# Patient Record
Sex: Male | Born: 1970 | Race: White | Hispanic: No | Smoking: Former smoker
Health system: Southern US, Community
[De-identification: ages and names within clinical notes are randomized; demographics above are authoritative.]

## PROBLEM LIST (undated history)

## (undated) DIAGNOSIS — G510 Bell's palsy: Secondary | ICD-10-CM

## (undated) DIAGNOSIS — R609 Edema, unspecified: Secondary | ICD-10-CM

## (undated) DIAGNOSIS — M549 Dorsalgia, unspecified: Secondary | ICD-10-CM

## (undated) DIAGNOSIS — D8689 Sarcoidosis of other sites: Secondary | ICD-10-CM

## (undated) DIAGNOSIS — I10 Essential (primary) hypertension: Secondary | ICD-10-CM

## (undated) DIAGNOSIS — M1990 Unspecified osteoarthritis, unspecified site: Secondary | ICD-10-CM

## (undated) DIAGNOSIS — T7840XA Allergy, unspecified, initial encounter: Secondary | ICD-10-CM

## (undated) DIAGNOSIS — K219 Gastro-esophageal reflux disease without esophagitis: Secondary | ICD-10-CM

## (undated) DIAGNOSIS — M8580 Other specified disorders of bone density and structure, unspecified site: Secondary | ICD-10-CM

## (undated) DIAGNOSIS — H539 Unspecified visual disturbance: Secondary | ICD-10-CM

## (undated) HISTORY — DX: Unspecified visual disturbance: H53.9

## (undated) HISTORY — DX: Edema, unspecified: R60.9

## (undated) HISTORY — DX: Dorsalgia, unspecified: M54.9

## (undated) HISTORY — PX: EYE SURGERY: SHX253

## (undated) HISTORY — DX: Essential (primary) hypertension: I10

## (undated) HISTORY — DX: Bell's palsy: G51.0

## (undated) HISTORY — DX: Sarcoidosis of other sites: D86.89

## (undated) HISTORY — DX: Unspecified osteoarthritis, unspecified site: M19.90

## (undated) HISTORY — DX: Other specified disorders of bone density and structure, unspecified site: M85.80

## (undated) HISTORY — DX: Gastro-esophageal reflux disease without esophagitis: K21.9

## (undated) HISTORY — DX: Allergy, unspecified, initial encounter: T78.40XA

---
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
## 1999-02-02 HISTORY — PX: HERNIA REPAIR: SHX51

## 2012-07-10 ENCOUNTER — Emergency Department (INDEPENDENT_AMBULATORY_CARE_PROVIDER_SITE_OTHER): Payer: 59

## 2012-07-10 ENCOUNTER — Emergency Department (HOSPITAL_COMMUNITY)
Admission: EM | Admit: 2012-07-10 | Discharge: 2012-07-10 | Disposition: A | Discharge status: Home / Self Care | Payer: 59 | Source: Home / Self Care | Attending: Family Medicine | Admitting: Family Medicine

## 2012-07-10 ENCOUNTER — Encounter (HOSPITAL_COMMUNITY): Payer: Self-pay | Admitting: Emergency Medicine

## 2012-07-10 DIAGNOSIS — S62609A Fracture of unspecified phalanx of unspecified finger, initial encounter for closed fracture: Secondary | ICD-10-CM

## 2012-07-10 NOTE — ED Provider Notes (Signed)
Medical screening examination/treatment/procedure(s) were performed by non-physician practitioner and as supervising physician I was immediately available for consultation/collaboration.   [name],Adrian [name]; MD  Adrian Mundorf Moreno-Coll, MD 07/10/12 1546 

## 2012-07-10 NOTE — ED Provider Notes (Signed)
History     CSN: [id number]  Arrival date & time 07/10/12  1054   First MD Initiated Contact with Patient 07/10/12 1249      Chief Complaint  Patient presents with  . Hand Injury    (Consider location/radiation/quality/duration/timing/severity/associated sxs/prior treatment) HPI Comments: Pt presents c/o left hand injury Friday while playing softball.  He was going to catch a ball when he slipped and caught himself on the ground with his left hand in the glove.  He had immediate pain and some swelling in the proximal part of the ring finger.  He had almost immediate swelling in the finger and since then the entire hand has started to swell.  He has been icing it and taking ibuprofen but the pain and swelling continues to increase.  Denies any numbness distal to the area that hurts.    Patient is a 42 y.o. male presenting with hand injury.  Hand Injury Associated symptoms: no fatigue, no fever and no neck pain     History reviewed. No pertinent past medical history.  Past Surgical History  Procedure Laterality Date  . Eye surgery    . Brain surgery      No family history on file.  History  Substance Use Topics  . Smoking status: Never Smoker   . Smokeless tobacco: Not on file  . Alcohol Use: No      Review of Systems  Constitutional: Negative for fever, chills and fatigue.  HENT: Negative for sore throat, neck pain and neck stiffness.   Eyes: Negative for visual disturbance.  Respiratory: Negative for cough and shortness of breath.   Cardiovascular: Negative for chest pain, palpitations and leg swelling.  Gastrointestinal: Negative for nausea, vomiting, abdominal pain, diarrhea and constipation.  Genitourinary: Negative for dysuria, urgency, frequency and hematuria.  Musculoskeletal: Negative for myalgias and arthralgias.       See HPI   Skin: Negative for rash.  Neurological: Negative for dizziness, weakness and light-headedness.    Allergies  Review of  patient's allergies indicates no known allergies.  Home Medications  No current outpatient prescriptions on file.  BP 149/89  Pulse 79  Temp(Src) 98.1 F (36.7 C) (Oral)  Resp 24  SpO2 97%  Physical Exam  Constitutional: He is oriented to person, place, and time. He appears well-developed and well-nourished. No distress.  HENT:  Head: Normocephalic and atraumatic.  Eyes: EOM are normal. Pupils are equal, round, and reactive to light.  Cardiovascular: Normal rate and regular rhythm.  Exam reveals no gallop and no friction rub.   No murmur heard. Pulmonary/Chest: Effort normal and breath sounds normal. No respiratory distress. He has no wheezes. He has no rales.  Abdominal: Soft. There is no tenderness.  Musculoskeletal:       Left hand: He exhibits decreased range of motion, tenderness (all along 4th digit, worst at proximal phalange) and swelling (diffuse hand swelling, worse at 4th digit.  Ecchymosis in 4th and 5th digits as well ). He exhibits normal two-point discrimination and normal capillary refill. Normal sensation noted.  Neurological: He is oriented to person, place, and time.  Skin: Skin is warm and dry. No rash noted.  Psychiatric: He has a normal mood and affect. Judgment normal.    ED Course  Procedures (including critical care time)  Labs Reviewed - No data to display Dg Hand Complete Left  07/10/2012   *RADIOLOGY REPORT*  Clinical Data: Left hand injury and pain.  LEFT HAND - COMPLETE 3+ VIEW  Comparison:  None  Findings: A mildly comminuted oblique fracture through the ring finger proximal phalanx is noted.  This does not appear to reach the articular surface. A nondisplaced intrarticular fracture at the base of the little finger proximal phalanx is noted. There is no evidence of subluxation or dislocation. No other focal bony abnormalities are identified. There is no evidence of radiopaque foreign body.  IMPRESSION: Proximal phalanx fractures of the ring and little  fingers as described.   Original Report Authenticated By: Harmon Pier, M.D.     1. Finger fracture, left, closed, initial encounter       MDM  Spoke with hand surgeon on call, he would like to see pt in the office right now.  Pt agrees and will head over there now.          Graylon Good, PA-C 07/10/12 1430

## 2012-07-10 NOTE — ED Notes (Signed)
Pt c/o left hand inj onset Friday while playing soft ball Reports he fell on field and landed on left hand while he still had his glove on Sxs today include: swelling, bruising, mild pain w/movements Has been icing hand and taking ibuprofen... He is alert and oriented w/no signs of acute distress.

## 2012-07-11 ENCOUNTER — Encounter (HOSPITAL_BASED_OUTPATIENT_CLINIC_OR_DEPARTMENT_OTHER): Payer: Self-pay | Admitting: *Deleted

## 2012-07-11 NOTE — Progress Notes (Signed)
No labs needed

## 2012-07-12 ENCOUNTER — Other Ambulatory Visit: Payer: Self-pay | Admitting: Orthopedic Surgery

## 2012-07-17 ENCOUNTER — Encounter (HOSPITAL_BASED_OUTPATIENT_CLINIC_OR_DEPARTMENT_OTHER): Payer: Self-pay | Admitting: *Deleted

## 2012-07-17 ENCOUNTER — Ambulatory Visit (HOSPITAL_BASED_OUTPATIENT_CLINIC_OR_DEPARTMENT_OTHER): Payer: 59 | Admitting: Certified Registered"

## 2012-07-17 ENCOUNTER — Ambulatory Visit (HOSPITAL_COMMUNITY): Payer: 59

## 2012-07-17 ENCOUNTER — Ambulatory Visit (HOSPITAL_BASED_OUTPATIENT_CLINIC_OR_DEPARTMENT_OTHER)
Admission: RE | Admit: 2012-07-17 | Discharge: 2012-07-17 | Disposition: A | Discharge status: Home / Self Care | Payer: 59 | Source: Ambulatory Visit | Attending: Orthopedic Surgery | Admitting: Orthopedic Surgery

## 2012-07-17 ENCOUNTER — Encounter (HOSPITAL_BASED_OUTPATIENT_CLINIC_OR_DEPARTMENT_OTHER)
Admission: RE | Disposition: A | Discharge status: Home / Self Care | Payer: Self-pay | Source: Ambulatory Visit | Attending: Orthopedic Surgery

## 2012-07-17 ENCOUNTER — Encounter (HOSPITAL_BASED_OUTPATIENT_CLINIC_OR_DEPARTMENT_OTHER): Payer: Self-pay | Admitting: Certified Registered"

## 2012-07-17 DIAGNOSIS — Z87891 Personal history of nicotine dependence: Secondary | ICD-10-CM | POA: Insufficient documentation

## 2012-07-17 DIAGNOSIS — Y9364 Activity, baseball: Secondary | ICD-10-CM | POA: Insufficient documentation

## 2012-07-17 DIAGNOSIS — IMO0002 Reserved for concepts with insufficient information to code with codable children: Secondary | ICD-10-CM | POA: Insufficient documentation

## 2012-07-17 DIAGNOSIS — W219XXA Striking against or struck by unspecified sports equipment, initial encounter: Secondary | ICD-10-CM | POA: Insufficient documentation

## 2012-07-17 HISTORY — PX: OPEN REDUCTION INTERNAL FIXATION (ORIF) PROXIMAL PHALANX: SHX6235

## 2012-07-17 LAB — POCT HEMOGLOBIN-HEMACUE: Hemoglobin: 16.8 g/dL (ref 13.0–17.0)

## 2012-07-17 SURGERY — OPEN REDUCTION INTERNAL FIXATION (ORIF) PROXIMAL PHALANX
Anesthesia: General | Site: Hand | Laterality: Left | Wound class: Clean

## 2012-07-17 MED ORDER — MIDAZOLAM HCL 2 MG/2ML IJ SOLN
1.0000 mg | INTRAMUSCULAR | Status: DC | PRN
  Administered 2012-07-17: 2 mg via INTRAVENOUS

## 2012-07-17 MED ORDER — OXYCODONE HCL 5 MG/5ML PO SOLN: 5.0000 mg | Freq: Once | ORAL | Status: DC | PRN

## 2012-07-17 MED ORDER — ONDANSETRON HCL 4 MG/2ML IJ SOLN
INTRAMUSCULAR | Status: DC | PRN
  Administered 2012-07-17: 4 mg via INTRAVENOUS

## 2012-07-17 MED ORDER — FENTANYL CITRATE 0.05 MG/ML IJ SOLN
50.0000 ug | INTRAMUSCULAR | Status: DC | PRN
  Administered 2012-07-17: 100 ug via INTRAVENOUS

## 2012-07-17 MED ORDER — BUPIVACAINE-EPINEPHRINE PF 0.5-1:200000 % IJ SOLN
INTRAMUSCULAR | Status: DC | PRN
  Administered 2012-07-17: 23 mL

## 2012-07-17 MED ORDER — PROPOFOL 10 MG/ML IV BOLUS
INTRAVENOUS | Status: DC | PRN
  Administered 2012-07-17: 200 mg via INTRAVENOUS

## 2012-07-17 MED ORDER — ONDANSETRON HCL 4 MG/2ML IJ SOLN: 4.0000 mg | Freq: Once | INTRAMUSCULAR | Status: DC | PRN

## 2012-07-17 MED ORDER — LIDOCAINE HCL (CARDIAC) 20 MG/ML IV SOLN
INTRAVENOUS | Status: DC | PRN
  Administered 2012-07-17: 50 mg via INTRAVENOUS

## 2012-07-17 MED ORDER — LACTATED RINGERS IV SOLN
INTRAVENOUS | Status: DC
  Administered 2012-07-17 (×2): via INTRAVENOUS

## 2012-07-17 MED ORDER — OXYCODONE HCL 5 MG PO TABS: 5.0000 mg | ORAL_TABLET | Freq: Once | ORAL | Status: DC | PRN

## 2012-07-17 MED ORDER — LACTATED RINGERS IV SOLN: INTRAVENOUS | Status: DC

## 2012-07-17 MED ORDER — HYDROMORPHONE HCL PF 1 MG/ML IJ SOLN: 0.2500 mg | INTRAMUSCULAR | Status: DC | PRN

## 2012-07-17 MED ORDER — CEFAZOLIN SODIUM-DEXTROSE 2-3 GM-% IV SOLR
2.0000 g | INTRAVENOUS | Status: AC
  Administered 2012-07-17: 2 g via INTRAVENOUS

## 2012-07-17 SURGICAL SUPPLY — 49 items
BANDAGE COBAN STERILE 2 (GAUZE/BANDAGES/DRESSINGS) IMPLANT
BANDAGE GAUZE ELAST BULKY 4 IN (GAUZE/BANDAGES/DRESSINGS) ×2 IMPLANT
BIT DRILL 1.1 (BIT) ×1
BIT DRILL 60X20X1.1XQC TMX (BIT) ×1 IMPLANT
BIT DRIVER 1.3 (BIT) IMPLANT
BIT DRL 60X20X1.1XQC TMX (BIT) ×1
BLADE MINI RND TIP GREEN BEAV (BLADE) IMPLANT
BLADE SURG 15 STRL LF DISP TIS (BLADE) ×1 IMPLANT
BLADE SURG 15 STRL SS (BLADE) ×1
BNDG COHESIVE 4X5 TAN STRL (GAUZE/BANDAGES/DRESSINGS) ×2 IMPLANT
BNDG ESMARK 4X9 LF (GAUZE/BANDAGES/DRESSINGS) ×2 IMPLANT
CHLORAPREP W/TINT 26ML (MISCELLANEOUS) ×2 IMPLANT
COVER MAYO STAND STRL (DRAPES) ×2 IMPLANT
COVER TABLE BACK 60X90 (DRAPES) ×2 IMPLANT
CUFF TOURNIQUET SINGLE 18IN (TOURNIQUET CUFF) ×2 IMPLANT
DRAPE C-ARM 42X72 X-RAY (DRAPES) ×2 IMPLANT
DRAPE EXTREMITY T 121X128X90 (DRAPE) ×2 IMPLANT
DRAPE SURG 17X23 STRL (DRAPES) ×2 IMPLANT
DRIVER BIT 1.5 (TRAUMA) IMPLANT
DRSG EMULSION OIL 3X3 NADH (GAUZE/BANDAGES/DRESSINGS) ×2 IMPLANT
GLOVE BIO SURGEON STRL SZ 6.5 (GLOVE) ×4 IMPLANT
GLOVE BIO SURGEON STRL SZ7.5 (GLOVE) ×2 IMPLANT
GLOVE BIOGEL PI IND STRL 7.0 (GLOVE) ×2 IMPLANT
GLOVE BIOGEL PI IND STRL 8 (GLOVE) ×1 IMPLANT
GLOVE BIOGEL PI INDICATOR 7.0 (GLOVE) ×2
GLOVE BIOGEL PI INDICATOR 8 (GLOVE) ×1
GOWN PREVENTION PLUS XLARGE (GOWN DISPOSABLE) ×6 IMPLANT
K-WIRE .045X4 (WIRE) ×4 IMPLANT
NEEDLE HYPO 25X1 1.5 SAFETY (NEEDLE) IMPLANT
NS IRRIG 1000ML POUR BTL (IV SOLUTION) ×2 IMPLANT
PACK BASIN DAY SURGERY FS (CUSTOM PROCEDURE TRAY) ×2 IMPLANT
PAD CAST 4YDX4 CTTN HI CHSV (CAST SUPPLIES) ×1 IMPLANT
PADDING CAST ABS 4INX4YD NS (CAST SUPPLIES) ×1
PADDING CAST ABS COTTON 4X4 ST (CAST SUPPLIES) ×1 IMPLANT
PADDING CAST COTTON 4X4 STRL (CAST SUPPLIES) ×1
PLATE T SMALL 1.5MM (Plate) ×2 IMPLANT
RUBBERBAND STERILE (MISCELLANEOUS) IMPLANT
SCREW L 1.5X12 (Screw) ×2 IMPLANT
SCREW LOCKING 1.5X11MM (Screw) ×8 IMPLANT
SCREW NL 1.5X13 (Screw) ×2 IMPLANT
SCREW NONIOC 1.5 10M (Screw) ×2 IMPLANT
SPONGE GAUZE 4X4 12PLY (GAUZE/BANDAGES/DRESSINGS) ×2 IMPLANT
STOCKINETTE 4X48 STRL (DRAPES) ×2 IMPLANT
SUT VICRYL RAPIDE 4/0 PS 2 (SUTURE) ×2 IMPLANT
SYR BULB 3OZ (MISCELLANEOUS) ×2 IMPLANT
SYRINGE 10CC LL (SYRINGE) IMPLANT
TOWEL OR 17X24 6PK STRL BLUE (TOWEL DISPOSABLE) ×2 IMPLANT
TOWEL OR NON WOVEN STRL DISP B (DISPOSABLE) ×2 IMPLANT
UNDERPAD 30X30 INCONTINENT (UNDERPADS AND DIAPERS) ×2 IMPLANT

## 2012-07-17 NOTE — Progress Notes (Signed)
  Assisted Dr. Crews with left, ultrasound guided, supraclavicular block. Side rails up, monitors on throughout procedure. See vital signs in flow sheet. Tolerated Procedure well. 

## 2012-07-17 NOTE — Op Note (Signed)
07/17/2012  8:16 AM  PATIENT:  Adrian [name]  42 y.o. male  PRE-OPERATIVE DIAGNOSIS:  Displaced left ring finger proximal phalanx fracture  POST-OPERATIVE DIAGNOSIS:  Same  PROCEDURE:  ORIF left ring finger proximal phalanx fracture  SURGEON: Cliffton Asters. Janee Morn, MD  PHYSICIAN ASSISTANT: None  ANESTHESIA:  regional and general  SPECIMENS:  None  DRAINS:   None  PREOPERATIVE INDICATIONS:  Adrian [name] is a  42 y.o. male with a diagnosis of displaced left ring finger proximal phalanx fracture. 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  The risks benefits and alternatives were discussed with the patient preoperatively including but not limited to the risks of infection, bleeding, nerve injury, cardiopulmonary complications, the need for revision surgery, among others, and the patient verbalized understanding and consented to proceed.  OPERATIVE IMPLANTS: Small T. plate from the [employer] hand fracture set, with appropriate screws  OPERATIVE FINDINGS: Diaphyseal fracture, mild degree of comminution, unable to obtain and maintain acceptable closed reduction and percutaneous stabilization. Following ORIF, reduction was near-anatomic. No persistent malrotation.  OPERATIVE PROCEDURE:  After receiving prophylactic antibiotics and a regional block by anesthesia, the patient was escorted to the operative theatre and placed in a supine position. General anesthesia was administered. A surgical "time-out" was performed during which the planned procedure, proposed operative site, and the correct patient identity were compared to the operative consent and agreement confirmed by the circulating nurse according to current facility policy.  Following application of a tourniquet to the operative extremity, the exposed skin was prepped with Chloraprep and draped in the usual sterile fashion.    An attempt was made to achieve percutaneous reduction and stabilization. Manipulation and tenaculum assisted  reduction was attempted. A 0.045 inch K wire was used but the resultant reduction proved unsatisfactory after several attempts. Decision was made to proceed with ORIF.  The limb was exsanguinated with an Esmarch bandage and the tourniquet inflated to approximately higher than systolic BP.  An incision was marked and made sharply with a scalpel on the dorsal radial aspect of the proximal phalanx, coursing obliquely across the creases at the MP and PIP joints. This created a dorsal flap that was retracted ulnarly. The extensor tendon was split in the midline and retracted radially and ulnarly. The periosteum and become thickened and this was split in the same manner reflected radially and ulnarly. The fracture was identified, cleaned of debris and irrigated. The fracture was reduced and held with a clamp and a small T. plate from the [employer] hand fracture set was applied on the dorsal radial aspect of the fracture. It was secured to the bone using a locking and nonlocking screws. Final images were obtained revealing satisfactory reduction and alignment. Grossly the digit had good alignment, rotation, and touchdown point. The wound is copiously irrigated and the periosteum closed with 4-0 Vicryl Rapide running suture. Tourniquet was released and additional hemostasis obtained and the extensor tendon was closed with running 3-0 Vicryl suture followed by closure of the skin with 4-0 Vicryl Rapide interrupted and running suture combination. A bulky hand dressing was applied the patient was awakened and taken to room stable condition breathing spontaneously  Disposition: Patient will be discharged home today with typical postop instructions returning in 5-7 days for reevaluation and custom protective splint fabrication in hand therapy.

## 2012-07-17 NOTE — Anesthesia Procedure Notes (Addendum)
Anesthesia Regional Block:  Supraclavicular block  Pre-Anesthetic Checklist: ,, timeout performed, Correct Patient, Correct Site, Correct Laterality, Correct Procedure, Correct Position, site marked, Risks and benefits discussed,  Surgical consent,  Pre-op evaluation,  At surgeon's request and post-op pain management  Laterality: Left and Upper  Prep: chloraprep       Needles:  Injection technique: Single-shot  Needle Type: Echogenic Needle     Needle Length: 5cm 5 cm Needle Gauge: 22 and 22 G    Additional Needles:  Procedures: ultrasound guided (picture in chart) Supraclavicular block Narrative:  Start time: 07/17/2012 8:02 AM End time: 07/17/2012 8:10 AM Injection made incrementally with aspirations every 5 mL.  Performed by: Personally  Anesthesiologist: Sheldon Silvan  Supraclavicular block Procedure Name: LMA Insertion Performed by: Lance Coon Pre-anesthesia Checklist: Patient identified, Emergency Drugs available, Suction available and Patient being monitored Patient Re-evaluated:Patient Re-evaluated prior to inductionOxygen Delivery Method: Circle System Utilized Preoxygenation: Pre-oxygenation with 100% oxygen Intubation Type: IV induction Ventilation: Mask ventilation without difficulty LMA: LMA inserted LMA Size: 5.0 Number of attempts: 1 Airway Equipment and Method: bite block Placement Confirmation: positive ETCO2 Tube secured with: Tape Dental Injury: Teeth and Oropharynx as per pre-operative assessment
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
## 2012-07-17 NOTE — Anesthesia Preprocedure Evaluation (Signed)
Anesthesia Evaluation  Patient identified by MRN, date of birth, ID band Patient awake    Reviewed: Allergy & Precautions, H&P , NPO status , Patient's Chart, lab work & pertinent test results  Airway Mallampati: I TM Distance: >3 FB Neck ROM: Full    Dental  (+) Teeth Intact   Pulmonary  breath sounds clear to auscultation        Cardiovascular Rhythm:Regular Rate:Normal     Neuro/Psych    GI/Hepatic   Endo/Other    Renal/GU      Musculoskeletal   Abdominal   Peds  Hematology   Anesthesia Other Findings   Reproductive/Obstetrics                           Anesthesia Physical Anesthesia Plan  ASA: I  Anesthesia Plan: General   Post-op Pain Management:    Induction: Intravenous  Airway Management Planned: LMA  Additional Equipment:   Intra-op Plan:   Post-operative Plan: Extubation in OR  Informed Consent: I have reviewed the patients History and Physical, chart, labs and discussed the procedure including the risks, benefits and alternatives for the proposed anesthesia with the patient or authorized representative who has indicated his/her understanding and acceptance.   Dental advisory given  Plan Discussed with: CRNA, Anesthesiologist and Surgeon  Anesthesia Plan Comments:         Anesthesia Quick Evaluation  

## 2012-07-17 NOTE — Anesthesia Postprocedure Evaluation (Signed)
  Anesthesia Post-op Note  Patient: Adrian [name]  Procedure(s) Performed: Procedure(s): OPEN REDUCTION INTERNAL FIXATION (ORIF) PROXIMAL PHALANX (Left)  Patient Location: PACU  Anesthesia Type:GA combined with regional for post-op pain  Level of Consciousness: awake, alert  and oriented  Airway and Oxygen Therapy: Patient Spontanous Breathing and Patient connected to face mask oxygen  Post-op Pain: none  Post-op Assessment: Post-op Vital signs reviewed  Post-op Vital Signs: Reviewed  Complications: No apparent anesthesia complications

## 2012-07-17 NOTE — Transfer of Care (Signed)
Immediate Anesthesia Transfer of Care Note  Patient: Adrian [name]  Procedure(s) Performed: Procedure(s): OPEN REDUCTION INTERNAL FIXATION (ORIF) PROXIMAL PHALANX (Left)  Patient Location: PACU  Anesthesia Type:GA combined with regional for post-op pain  Level of Consciousness: awake  Airway & Oxygen Therapy: Patient Spontanous Breathing and Patient connected to face mask oxygen  Post-op Assessment: Report given to PACU RN and Post -op Vital signs reviewed and stable  Post vital signs: Reviewed and stable  Complications: No apparent anesthesia complications

## 2012-07-18 ENCOUNTER — Encounter (HOSPITAL_BASED_OUTPATIENT_CLINIC_OR_DEPARTMENT_OTHER): Payer: Self-pay | Admitting: Orthopedic Surgery

## 2014-09-24 ENCOUNTER — Other Ambulatory Visit: Payer: Commercial Managed Care - HMO | Admitting: Internal Medicine

## 2014-09-24 DIAGNOSIS — Z13220 Encounter for screening for lipoid disorders: Secondary | ICD-10-CM

## 2014-09-24 DIAGNOSIS — Z0000 Encounter for general adult medical examination without abnormal findings: Secondary | ICD-10-CM

## 2014-09-24 LAB — LIPID PANEL
CHOL/HDL RATIO: 3 ratio (ref <=5.0)
CHOLESTEROL: 156 mg/dL (ref 125–200)
HDL: 52 mg/dL (ref >=40)
LDL Cholesterol: 87 mg/dL (ref <130)
TRIGLYCERIDES: 84 mg/dL (ref <150)
VLDL: 17 mg/dL (ref <30)

## 2014-09-24 LAB — CBC WITH DIFFERENTIAL/PLATELET
BASOS ABS: 0.1 10*3/uL (ref 0.0–0.1)
BASOS PCT: 1 % (ref 0–1)
Eosinophils Absolute: 0.1 10*3/uL (ref 0.0–0.7)
Eosinophils Relative: 2 % (ref 0–5)
HCT: 43.3 % (ref 39.0–52.0)
HEMOGLOBIN: 15 g/dL (ref 13.0–17.0)
Lymphocytes Relative: 44 % (ref 12–46)
Lymphs Abs: 2.5 10*3/uL (ref 0.7–4.0)
MCH: 31.5 pg (ref 26.0–34.0)
MCHC: 34.6 g/dL (ref 30.0–36.0)
MCV: 91 fL (ref 78.0–100.0)
MONOS PCT: 9 % (ref 3–12)
MPV: 10.4 fL (ref 8.6–12.4)
Monocytes Absolute: 0.5 10*3/uL (ref 0.1–1.0)
NEUTROS ABS: 2.5 10*3/uL (ref 1.7–7.7)
NEUTROS PCT: 44 % (ref 43–77)
Platelets: 258 10*3/uL (ref 150–400)
RBC: 4.76 MIL/uL (ref 4.22–5.81)
RDW: 13 % (ref 11.5–15.5)
WBC: 5.6 10*3/uL (ref 4.0–10.5)

## 2014-09-24 LAB — COMPLETE METABOLIC PANEL WITH GFR
ALT: 28 U/L (ref 9–46)
AST: 23 U/L (ref 10–40)
Albumin: 4 g/dL (ref 3.6–5.1)
Alkaline Phosphatase: 47 U/L (ref 40–115)
BUN: 10 mg/dL (ref 7–25)
CALCIUM: 9.1 mg/dL (ref 8.6–10.3)
CHLORIDE: 105 mmol/L (ref 98–110)
CO2: 26 mmol/L (ref 20–31)
Creat: 0.96 mg/dL (ref 0.60–1.35)
Glucose, Bld: 91 mg/dL (ref 65–99)
POTASSIUM: 4.2 mmol/L (ref 3.5–5.3)
Sodium: 141 mmol/L (ref 135–146)
Total Bilirubin: 0.9 mg/dL (ref 0.2–1.2)
Total Protein: 6.2 g/dL (ref 6.1–8.1)

## 2014-09-26 ENCOUNTER — Encounter: Payer: Self-pay | Admitting: Internal Medicine

## 2014-09-26 ENCOUNTER — Ambulatory Visit (INDEPENDENT_AMBULATORY_CARE_PROVIDER_SITE_OTHER): Payer: Commercial Managed Care - HMO | Admitting: Internal Medicine

## 2014-09-26 VITALS — BP 138/92 | HR 74 | Temp 98.7°F | Ht 72.0 in | Wt 222.0 lb

## 2014-09-26 DIAGNOSIS — R030 Elevated blood-pressure reading, without diagnosis of hypertension: Secondary | ICD-10-CM | POA: Diagnosis not present

## 2014-09-26 DIAGNOSIS — H6123 Impacted cerumen, bilateral: Secondary | ICD-10-CM | POA: Diagnosis not present

## 2014-09-26 DIAGNOSIS — Z0000 Encounter for general adult medical examination without abnormal findings: Secondary | ICD-10-CM

## 2014-09-26 DIAGNOSIS — IMO0001 Reserved for inherently not codable concepts without codable children: Secondary | ICD-10-CM

## 2014-09-26 LAB — POCT URINALYSIS DIPSTICK
BILIRUBIN UA: NEGATIVE
Blood, UA: NEGATIVE
GLUCOSE UA: NEGATIVE
Ketones, UA: NEGATIVE
Leukocytes, UA: NEGATIVE
NITRITE UA: NEGATIVE
Protein, UA: NEGATIVE
SPEC GRAV UA: 1.01
UROBILINOGEN UA: NEGATIVE
pH, UA: 6

## 2014-09-26 NOTE — Progress Notes (Signed)
   Subjective:    Patient ID: Adrian [name], male    DOB: 1970/10/06, 44 y.o.   MRN: [id number]  HPI  First visit for this 44 year old White Male, brother Adrian [name] who presents for health maintenance exam. Patient just lost his father to complications of AML.  Patient had tetanus immunization in 2015 at [facility] [address].  No known drug allergies  No chronic medications  Patient had fractured left fourth and fifth fingers in 2014 due to sports injury. Treated at [facility]. Left inguinal hernia repair 1998. In 1987 he had a periorbital fracture treated by Dr. Adrian [name].  Social history: He is divorced. 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. Does not smoke. Consumes alcohol in the form of beer occasionally. He enjoys softball canoeing swimming in outdoor activities as well as camping.  Family history: He has a son age 17 in good health and a daughter age 43 in good health. Father was 4 and expired recently due to sepsis being treated for AML at [facility]. Mother living with history of ovarian cancer age 36. One brother age 24 overweight with diabetes mellitus. One sister age 29 overweight.    Review of Systems negative except for tinnitus and occasional back pain     Objective:   Physical Exam  Constitutional: He is oriented to person, place, and time. He appears well-developed and well-nourished. No distress.  HENT:  Head: Normocephalic and atraumatic.  Right Ear: External ear normal.  Left Ear: External ear normal.  Nose: Nose normal.  Mouth/Throat: Oropharynx is clear and moist. No oropharyngeal exudate.  Bilateral impacted cerumen   Eyes: Conjunctivae and EOM are normal. Pupils are equal, round, and reactive to light. Right eye exhibits no discharge. Left eye exhibits no discharge.  Neck: Neck supple. No JVD present. No thyromegaly present.  Cardiovascular: Normal rate, regular rhythm, normal  heart sounds and intact distal pulses.   No murmur heard. Pulmonary/Chest: Effort normal and breath sounds normal. No respiratory distress. He has no wheezes. He has no rales.  Abdominal: Soft. Bowel sounds are normal. He exhibits no distension and no mass. There is no tenderness. There is no rebound and no guarding.  Genitourinary: Prostate normal.  No hernias  Musculoskeletal: He exhibits no edema.  Neurological: He is alert and oriented to person, place, and time. He has normal reflexes. No cranial nerve deficit. Coordination normal.  Skin: Skin is warm and dry. No rash noted. He is not diaphoretic.  Psychiatric: He has a normal mood and affect. His behavior is normal. Judgment and thought content normal.  Vitals reviewed.         Assessment & Plan:  Impacted cerumen both ears-return October 7 to have ears irrigated  Elevated blood pressure-to obtain home blood pressure monitor and return October 7 with home blood pressure readings. Watch diet and exercise. Watch salt intake.

## 2014-10-01 ENCOUNTER — Encounter: Payer: Self-pay | Admitting: Internal Medicine

## 2014-10-01 DIAGNOSIS — H6123 Impacted cerumen, bilateral: Secondary | ICD-10-CM | POA: Insufficient documentation

## 2014-10-01 DIAGNOSIS — R030 Elevated blood-pressure reading, without diagnosis of hypertension: Secondary | ICD-10-CM

## 2014-10-01 DIAGNOSIS — IMO0001 Reserved for inherently not codable concepts without codable children: Secondary | ICD-10-CM | POA: Insufficient documentation

## 2014-10-01 NOTE — Patient Instructions (Signed)
Return October 7 for 6 week blood pressure check and to have cerumen removed from ears by irrigation. Obtain home blood pressure monitor and watch blood pressure at home.

## 2014-11-08 ENCOUNTER — Ambulatory Visit (INDEPENDENT_AMBULATORY_CARE_PROVIDER_SITE_OTHER): Payer: Commercial Managed Care - HMO | Admitting: Internal Medicine

## 2014-11-08 ENCOUNTER — Encounter: Payer: Self-pay | Admitting: Internal Medicine

## 2014-11-08 VITALS — BP 122/84 | HR 80 | Temp 98.1°F | Ht 72.0 in | Wt 229.0 lb

## 2014-11-08 DIAGNOSIS — H6123 Impacted cerumen, bilateral: Secondary | ICD-10-CM

## 2014-11-08 DIAGNOSIS — Z136 Encounter for screening for cardiovascular disorders: Secondary | ICD-10-CM | POA: Diagnosis not present

## 2014-11-08 DIAGNOSIS — Z0130 Encounter for examination of blood pressure without abnormal findings: Secondary | ICD-10-CM

## 2014-11-30 NOTE — Patient Instructions (Signed)
He was pleasure to see you today. Return in one year or as needed. Continue to monitor home blood pressures.

## 2014-11-30 NOTE — Progress Notes (Signed)
   Subjective:    Patient ID: Adrian [name], male    DOB: 06/20/1970, 44 y.o.   MRN: [id number]  HPI Here today to follow-up on blood pressure which was elevated on initial visit. He did purchase home blood pressure cuff and has been watching blood pressure at home. Brings in multiple readings which are very acceptable. He also is here today to have earwax removed from both ears.    Review of Systems     Objective:   Physical Exam Impacted wax both ears removed with curette.       Assessment & Plan:  Based on readings that he brought in today and reading today he does not have essential hypertension at this point in time. He likely had an element of office hypertension on his initial visit. He is to continue to monitor blood pressure at home and let me know if it is elevated.  Impacted cerumen removed today back curette from both the ears.  Plan: Return in one year or as needed.

## 2016-02-02 HISTORY — PX: EYE MUSCLE SURGERY: SHX370

## 2016-06-29 ENCOUNTER — Encounter: Payer: Self-pay | Admitting: Internal Medicine

## 2016-06-29 DIAGNOSIS — H4922 Sixth [abducent] nerve palsy, left eye: Secondary | ICD-10-CM | POA: Diagnosis not present

## 2016-07-08 ENCOUNTER — Encounter: Payer: Self-pay | Admitting: Internal Medicine

## 2016-07-08 ENCOUNTER — Ambulatory Visit (INDEPENDENT_AMBULATORY_CARE_PROVIDER_SITE_OTHER): Payer: 59 | Admitting: Internal Medicine

## 2016-07-08 VITALS — BP 142/100 | HR 88 | Temp 98.6°F | Ht 72.0 in | Wt 254.0 lb

## 2016-07-08 DIAGNOSIS — H4922 Sixth [abducent] nerve palsy, left eye: Secondary | ICD-10-CM | POA: Diagnosis not present

## 2016-07-08 DIAGNOSIS — R030 Elevated blood-pressure reading, without diagnosis of hypertension: Secondary | ICD-10-CM | POA: Diagnosis not present

## 2016-07-09 ENCOUNTER — Other Ambulatory Visit: Payer: Commercial Managed Care - HMO | Admitting: Internal Medicine

## 2016-07-11 ENCOUNTER — Encounter: Payer: Self-pay | Admitting: Internal Medicine

## 2016-07-11 NOTE — Progress Notes (Signed)
   Subjective:    Patient ID: Adrian [name], male    DOB: 08/20/1970, 46 y.o.   MRN: [id number]  HPI 46 year old Male, brother Ina KickRon Ackerley first presented to this office April 2016 for physical examination. He was subsequently seen October 2016 for impacted cerumen in both ears and a blood pressure check. His blood pressure was 138/92 at his initial visit in August 2016 and was 122/84 at his October 2016 visit. He has not been seen since that time. Recently developed some double vision that had rather acute onset. He was seen by Dr. Sherryll BurgerShah  at [facility] and was diagnosed with left sixth nerve palsy. He's never had this before. He is not known to have diabetes.In  2016 ,his fasting glucose was normal at 91 and he had normal lipid panel.  Dr. Sherryll BurgerShah asked that we reassess him today for the possibility of issues such as diabetes, hypertension ,and hyperlipidemia.  Past medical history: No known drug allergies, no chronic medications. He had a tetanus immunization 2015 at [facility] on [address].  He had fractured left fourth and fifth fingers in 2014 due to a sports injury which was [facility]. Left inguinal hernia repair 1998. In 1987, he had a left periorbital  fracture treated by Dr. Consuello ClossHelen Stinson, plastic surgeon.  Social history: He is divorced. 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. Does not smoke. Consumes alcohol the form appear occasionally. He enjoys softball, canoeing, swimming, outdoor activities as well as camping. He has a significant other who is a Therapist, musichospice nurse.  Family history: He has a teenage son in good health and a teenage daughter in good health. Father died at age 47 due to sepsis being treated for AML. Mother with history of ovarian cancer. One brother who is overweight with history of diabetes mellitus and MI. One sister overweight in her 7950s.     Review of Systems no history of chest pain or  shortness of breath.  He is not fasting today and will return next week for fasting lab studies     Objective:   Physical Exam Skin warm and dry, nodes none. Left sixth nerve palsy noted on extraocular movement testing. PERRLA. TMs and pharynx are clear. Neck is supple. No JVD thyromegaly or carotid bruits. Chest clear. Cardiac exam regular rate and rhythm normal S1 and S2. Abdomen no hepatosplenomegaly masses or tenderness. Extremities without edema. Neuro no gross focal deficits except for the left sixth nerve palsy.       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 Fasting lab work to be drawn next week when he is fasting with results to be sent to Dr. Sherryll [facility]Shah [facility]. Patient understands left sixth nerve palsy will resolve with time.  Elevated blood pressure. His blood pressure was 142/100 in the exam room. This will need follow-up.  BMI 34.45-needs diet and exercise regimen

## 2016-07-11 NOTE — Patient Instructions (Addendum)
To have fasting lab work next week including hemoglobin A1c, CBC, C met and fasting lipid panel. Needs repeat blood pressure check. Needs diet and exercise regimen.

## 2016-07-12 ENCOUNTER — Encounter: Payer: Self-pay | Admitting: Internal Medicine

## 2016-07-12 ENCOUNTER — Other Ambulatory Visit (INDEPENDENT_AMBULATORY_CARE_PROVIDER_SITE_OTHER): Payer: Commercial Managed Care - HMO | Admitting: Internal Medicine

## 2016-07-12 VITALS — BP 152/98 | HR 77 | Temp 97.5°F

## 2016-07-12 DIAGNOSIS — Z0000 Encounter for general adult medical examination without abnormal findings: Secondary | ICD-10-CM

## 2016-07-12 DIAGNOSIS — H4922 Sixth [abducent] nerve palsy, left eye: Secondary | ICD-10-CM

## 2016-07-12 LAB — COMPLETE METABOLIC PANEL WITH GFR
ALBUMIN: 3.9 g/dL (ref 3.6–5.1)
ALT: 18 U/L (ref 9–46)
AST: 17 U/L (ref 10–40)
Alkaline Phosphatase: 45 U/L (ref 40–115)
BUN: 12 mg/dL (ref 7–25)
CALCIUM: 9.1 mg/dL (ref 8.6–10.3)
CHLORIDE: 104 mmol/L (ref 98–110)
CO2: 26 mmol/L (ref 20–31)
CREATININE: 1.08 mg/dL (ref 0.60–1.35)
GFR, Est African American: >89 mL/min (ref >=60)
GFR, Est Non African American: 82 mL/min (ref >=60)
Glucose, Bld: 95 mg/dL (ref 65–99)
Potassium: 4.6 mmol/L (ref 3.5–5.3)
Sodium: 141 mmol/L (ref 135–146)
TOTAL PROTEIN: 6.6 g/dL (ref 6.1–8.1)
Total Bilirubin: 0.7 mg/dL (ref 0.2–1.2)

## 2016-07-12 LAB — CBC WITH DIFFERENTIAL/PLATELET
BASOS PCT: 1 %
Basophils Absolute: 49 cells/uL (ref 0–200)
Eosinophils Absolute: 98 cells/uL (ref 15–500)
Eosinophils Relative: 2 %
HEMATOCRIT: 44.7 % (ref 38.5–50.0)
HEMOGLOBIN: 15.1 g/dL (ref 13.2–17.1)
Lymphocytes Relative: 44 %
Lymphs Abs: 2156 cells/uL (ref 850–3900)
MCH: 31.1 pg (ref 27.0–33.0)
MCHC: 33.8 g/dL (ref 32.0–36.0)
MCV: 92 fL (ref 80.0–100.0)
MONO ABS: 441 {cells}/uL (ref 200–950)
MPV: 10.2 fL (ref 7.5–12.5)
Monocytes Relative: 9 %
Neutro Abs: 2156 cells/uL (ref 1500–7800)
Neutrophils Relative %: 44 %
Platelets: 268 10*3/uL (ref 140–400)
RBC: 4.86 MIL/uL (ref 4.20–5.80)
RDW: 13 % (ref 11.0–15.0)
WBC: 4.9 10*3/uL (ref 3.8–10.8)

## 2016-07-12 LAB — MICROALBUMIN / CREATININE URINE RATIO
Creatinine, Urine: 190 mg/dL (ref 20–370)
MICROALB UR: 0.4 mg/dL
MICROALB/CREAT RATIO: 2 ug/mg{creat} (ref <30)

## 2016-07-12 LAB — LIPID PANEL
CHOLESTEROL: 137 mg/dL (ref <200)
HDL: 44 mg/dL (ref >40)
LDL Cholesterol: 78 mg/dL (ref <100)
Total CHOL/HDL Ratio: 3.1 Ratio (ref <5.0)
Triglycerides: 74 mg/dL (ref <150)
VLDL: 15 mg/dL (ref <30)

## 2016-07-12 NOTE — Progress Notes (Signed)
His blood pressure is elevated at 160/100. He says he is not aware of any elevated blood pressure in the past. His significant other is a nurse and I would like for him to bring me in at least 3 readings at home when he returns on Friday at 4:30 PM for follow-up.

## 2016-07-13 LAB — HEMOGLOBIN A1C
Hgb A1c MFr Bld: 5.1 % (ref <5.7)
Mean Plasma Glucose: 100 mg/dL

## 2016-07-13 LAB — PSA: PSA: 0.3 ng/mL (ref <=4.0)

## 2016-07-16 ENCOUNTER — Encounter: Payer: Self-pay | Admitting: Internal Medicine

## 2016-07-16 ENCOUNTER — Ambulatory Visit (INDEPENDENT_AMBULATORY_CARE_PROVIDER_SITE_OTHER): Payer: 59 | Admitting: Internal Medicine

## 2016-07-16 VITALS — BP 158/92 | HR 91 | Temp 98.1°F | Wt 266.0 lb

## 2016-07-16 DIAGNOSIS — I10 Essential (primary) hypertension: Secondary | ICD-10-CM | POA: Diagnosis not present

## 2016-07-16 DIAGNOSIS — H4922 Sixth [abducent] nerve palsy, left eye: Secondary | ICD-10-CM | POA: Insufficient documentation

## 2016-07-16 MED ORDER — AMLODIPINE BESYLATE 5 MG PO TABS: 5.0000 mg | ORAL_TABLET | Freq: Every day | ORAL | 3 refills | Status: DC

## 2016-07-16 NOTE — Progress Notes (Signed)
   Subjective:    Patient ID: Adrian [name], male    DOB: 1970-10-17, 46 y.o.   MRN: [id number]  HPI  46 year old Male for Follow-up of elevated blood pressure noted on recent visit here. Recently diagnosed by ophthalmologist with left VI nerve palsy. He is wearing an eye patch over left eye.  Brings in BP  readings from home of 145/102, 142/98, 138/100.  Had fasting lab work done on June 11. Hemoglobin A1c was normal at 5.1%. Lipid panel was entirely normal. CBC and C-met were both normal. PSA was 0.3  Review of Systems     Objective:   Physical Exam Chest clear. Cardiac exam regular rate and rhythm. Extremities without edema.       Assessment & Plan:  Essential hypertension  Left sixth nerve palsy  Plan: Start amlodipine 5 mg daily and follow-up July 9

## 2016-07-16 NOTE — Patient Instructions (Signed)
No evidence of diabetes. Hemoglobin A1c is normal. Start amlodipine 5 mg daily and follow-up July 9.

## 2016-07-28 ENCOUNTER — Telehealth: Payer: Self-pay

## 2016-07-28 NOTE — Telephone Encounter (Signed)
Pt called and stated that he was feeling dizzy and has been stumbling since starting the new blood pressure medication. Pt has not checked his blood pressure since Thursday and on that day it was still the same as what it has been in the office. Asked pt to come in today to allow me to check it even though office is closed, he denied and we made an appt for tomorrow at [phone] since that was the only time he could come in

## 2016-07-29 ENCOUNTER — Emergency Department (HOSPITAL_COMMUNITY): Payer: 59

## 2016-07-29 ENCOUNTER — Encounter: Payer: Self-pay | Admitting: Internal Medicine

## 2016-07-29 ENCOUNTER — Inpatient Hospital Stay (HOSPITAL_COMMUNITY)
Admission: EM | Admit: 2016-07-29 | Discharge: 2016-08-02 | DRG: 123 | Disposition: A | Discharge status: Home / Self Care | Payer: 59 | Attending: Internal Medicine | Admitting: Internal Medicine

## 2016-07-29 ENCOUNTER — Encounter (HOSPITAL_COMMUNITY): Payer: Self-pay | Admitting: Emergency Medicine

## 2016-07-29 ENCOUNTER — Ambulatory Visit (INDEPENDENT_AMBULATORY_CARE_PROVIDER_SITE_OTHER): Payer: 59 | Admitting: Internal Medicine

## 2016-07-29 VITALS — Wt 243.0 lb

## 2016-07-29 DIAGNOSIS — F17220 Nicotine dependence, chewing tobacco, uncomplicated: Secondary | ICD-10-CM | POA: Diagnosis present

## 2016-07-29 DIAGNOSIS — H4922 Sixth [abducent] nerve palsy, left eye: Secondary | ICD-10-CM

## 2016-07-29 DIAGNOSIS — D8689 Sarcoidosis of other sites: Secondary | ICD-10-CM | POA: Diagnosis present

## 2016-07-29 DIAGNOSIS — D869 Sarcoidosis, unspecified: Secondary | ICD-10-CM | POA: Diagnosis not present

## 2016-07-29 DIAGNOSIS — G589 Mononeuropathy, unspecified: Secondary | ICD-10-CM | POA: Diagnosis not present

## 2016-07-29 DIAGNOSIS — R911 Solitary pulmonary nodule: Secondary | ICD-10-CM | POA: Diagnosis present

## 2016-07-29 DIAGNOSIS — R270 Ataxia, unspecified: Secondary | ICD-10-CM

## 2016-07-29 DIAGNOSIS — R937 Abnormal findings on diagnostic imaging of other parts of musculoskeletal system: Secondary | ICD-10-CM | POA: Diagnosis not present

## 2016-07-29 DIAGNOSIS — R42 Dizziness and giddiness: Secondary | ICD-10-CM | POA: Diagnosis not present

## 2016-07-29 DIAGNOSIS — W19XXXA Unspecified fall, initial encounter: Secondary | ICD-10-CM

## 2016-07-29 DIAGNOSIS — R2681 Unsteadiness on feet: Secondary | ICD-10-CM

## 2016-07-29 DIAGNOSIS — H532 Diplopia: Secondary | ICD-10-CM

## 2016-07-29 DIAGNOSIS — R200 Anesthesia of skin: Secondary | ICD-10-CM

## 2016-07-29 DIAGNOSIS — G936 Cerebral edema: Secondary | ICD-10-CM | POA: Diagnosis present

## 2016-07-29 DIAGNOSIS — R4701 Aphasia: Secondary | ICD-10-CM | POA: Diagnosis present

## 2016-07-29 DIAGNOSIS — R030 Elevated blood-pressure reading, without diagnosis of hypertension: Secondary | ICD-10-CM | POA: Diagnosis not present

## 2016-07-29 DIAGNOSIS — I10 Essential (primary) hypertension: Secondary | ICD-10-CM | POA: Diagnosis present

## 2016-07-29 DIAGNOSIS — Z862 Personal history of diseases of the blood and blood-forming organs and certain disorders involving the immune mechanism: Secondary | ICD-10-CM | POA: Diagnosis not present

## 2016-07-29 DIAGNOSIS — R930 Abnormal findings on diagnostic imaging of skull and head, not elsewhere classified: Secondary | ICD-10-CM | POA: Diagnosis present

## 2016-07-29 DIAGNOSIS — R262 Difficulty in walking, not elsewhere classified: Secondary | ICD-10-CM | POA: Diagnosis present

## 2016-07-29 DIAGNOSIS — R29810 Facial weakness: Secondary | ICD-10-CM | POA: Diagnosis present

## 2016-07-29 LAB — CBC
HCT: 42.3 % (ref 39.0–52.0)
Hemoglobin: 14.4 g/dL (ref 13.0–17.0)
MCH: 31 pg (ref 26.0–34.0)
MCHC: 34 g/dL (ref 30.0–36.0)
MCV: 91 fL (ref 78.0–100.0)
Platelets: 246 10*3/uL (ref 150–400)
RBC: 4.65 MIL/uL (ref 4.22–5.81)
RDW: 12.3 % (ref 11.5–15.5)
WBC: 6.9 10*3/uL (ref 4.0–10.5)

## 2016-07-29 LAB — COMPREHENSIVE METABOLIC PANEL
ALT: 33 U/L (ref 17–63)
AST: 23 U/L (ref 15–41)
Albumin: 3.7 g/dL (ref 3.5–5.0)
Alkaline Phosphatase: 50 U/L (ref 38–126)
Anion gap: 6 (ref 5–15)
BUN: 12 mg/dL (ref 6–20)
CALCIUM: 8.7 mg/dL — AB (ref 8.9–10.3)
CO2: 26 mmol/L (ref 22–32)
CREATININE: 1.06 mg/dL (ref 0.61–1.24)
Chloride: 106 mmol/L (ref 101–111)
GFR calc non Af Amer: >60 mL/min (ref >60)
GLUCOSE: 147 mg/dL — AB (ref 65–99)
POTASSIUM: 3.7 mmol/L (ref 3.5–5.1)
SODIUM: 138 mmol/L (ref 135–145)
TOTAL PROTEIN: 6.3 g/dL — AB (ref 6.5–8.1)
Total Bilirubin: 0.8 mg/dL (ref 0.3–1.2)

## 2016-07-29 LAB — I-STAT CHEM 8, ED
BUN: 14 mg/dL (ref 6–20)
CALCIUM ION: 1.14 mmol/L — AB (ref 1.15–1.40)
CREATININE: 1 mg/dL (ref 0.61–1.24)
Chloride: 105 mmol/L (ref 101–111)
GLUCOSE: 135 mg/dL — AB (ref 65–99)
HCT: 41 % (ref 39.0–52.0)
HEMOGLOBIN: 13.9 g/dL (ref 13.0–17.0)
Potassium: 3.7 mmol/L (ref 3.5–5.1)
Sodium: 139 mmol/L (ref 135–145)
TCO2: 25 mmol/L (ref 0–100)

## 2016-07-29 LAB — DIFFERENTIAL
Basophils Absolute: 0 10*3/uL (ref 0.0–0.1)
Basophils Relative: 0 %
EOS PCT: 2 %
Eosinophils Absolute: 0.2 10*3/uL (ref 0.0–0.7)
Lymphocytes Relative: 28 %
Lymphs Abs: 1.9 10*3/uL (ref 0.7–4.0)
MONO ABS: 0.5 10*3/uL (ref 0.1–1.0)
Monocytes Relative: 7 %
NEUTROS ABS: 4.3 10*3/uL (ref 1.7–7.7)
NEUTROS PCT: 63 %

## 2016-07-29 LAB — URINALYSIS, ROUTINE W REFLEX MICROSCOPIC
Bilirubin Urine: NEGATIVE
Glucose, UA: NEGATIVE mg/dL
Hgb urine dipstick: NEGATIVE
KETONES UR: NEGATIVE mg/dL
LEUKOCYTES UA: NEGATIVE
Nitrite: NEGATIVE
PROTEIN: NEGATIVE mg/dL
Specific Gravity, Urine: 1.021 (ref 1.005–1.030)
pH: 5 (ref 5.0–8.0)

## 2016-07-29 LAB — I-STAT TROPONIN, ED: TROPONIN I, POC: 0 ng/mL (ref 0.00–0.08)

## 2016-07-29 LAB — RAPID URINE DRUG SCREEN, HOSP PERFORMED
Amphetamines: NOT DETECTED
BARBITURATES: NOT DETECTED
Benzodiazepines: NOT DETECTED
COCAINE: NOT DETECTED
OPIATES: NOT DETECTED
TETRAHYDROCANNABINOL: NOT DETECTED

## 2016-07-29 LAB — PROTIME-INR
INR: 0.98
PROTHROMBIN TIME: 12.9 s (ref 11.4–15.2)

## 2016-07-29 LAB — ETHANOL: Alcohol, Ethyl (B): <5 mg/dL (ref <5)

## 2016-07-29 LAB — APTT: aPTT: 28 seconds (ref 24–36)

## 2016-07-29 MED ORDER — PANTOPRAZOLE SODIUM 40 MG PO TBEC
40.0000 mg | DELAYED_RELEASE_TABLET | Freq: Every day | ORAL | Status: DC
  Administered 2016-07-30 – 2016-08-02 (×4): 40 mg via ORAL
  Filled 2016-07-29 (×4): qty 1

## 2016-07-29 MED ORDER — AMLODIPINE BESYLATE 5 MG PO TABS
5.0000 mg | ORAL_TABLET | Freq: Every day | ORAL | Status: DC
  Administered 2016-07-30 – 2016-08-02 (×4): 5 mg via ORAL
  Filled 2016-07-29 (×4): qty 1

## 2016-07-29 MED ORDER — SODIUM CHLORIDE 0.9 % IV SOLN
1000.0000 mg | Freq: Every day | INTRAVENOUS | Status: DC
  Administered 2016-07-30 – 2016-08-02 (×5): 1000 mg via INTRAVENOUS
  Filled 2016-07-29 (×6): qty 8

## 2016-07-29 MED ORDER — GADOBENATE DIMEGLUMINE 529 MG/ML IV SOLN
20.0000 mL | Freq: Once | INTRAVENOUS | Status: AC | PRN
  Administered 2016-07-29: 20 mL via INTRAVENOUS

## 2016-07-29 NOTE — ED Notes (Signed)
Pt eating at this time. Family at bedside 
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
## 2016-07-29 NOTE — Patient Instructions (Signed)
Please proceed immediately to the emergency department for further evaluation.

## 2016-07-29 NOTE — ED Triage Notes (Signed)
Onset 2 weeks ago developed double vision left eye and diagnosed with cranial nerve 6 palsy. 3 days ago developed slurred speech, intermittent drooling when drink liquid, and abnormal gait. Alert answering and following commands appropriate. Seen at Urgent Care today sent to ED for evaluation.

## 2016-07-29 NOTE — Progress Notes (Signed)
   Subjective:    Patient ID: Adrian [name], male    DOB: 10/22/1970, 46 y.o.   MRN: [id number]  HPI 46 year old Male recently seen on June 15 for elevated BP and left sixth nerve palsy. Was referred here for medical evaluation by ophthalmologist. Prior to June 15 he had not been seen here since 2016. He is wearing an eye patch for left sixth nerve palsy. His blood pressure was elevated at 158/92. There is a family history of hypertension. He was treated for elevated BP with Norvasc 5 mg daily. Onset dizziness yesterday and has numbness left face with droop . His significant other who accompanies him today and is a nurse says that she noticed left facial droop over the weekend of June 16. He went to work yesterday and apparently was so dizzy that he actually fell backwards while trying to climb a ladder. He did not get more than one run up on the ladder before he fell backwards but he did strike his head. Also apparently according to significant other, his blood pressure remained elevated despite taking Norvasc. He is not complaining of headache just being very dizzy.  See previous dictation June 15 for details of initial evaluation. His hemoglobin A1c was normal. Lipid panel, complete metabolic panel, CBC normal.    Review of Systems no nausea or vomiting. No complaint of headache.     Objective:   Physical Exam His skin is warm and dry. He is wearing an eye patch and has left sixth nerve palsy. Fundi are not well seen. He has no sensory deficits in the upper or lower extremities. Obvious left mouth droop. Muscle strength upper and lower extremities appear to be normal. He is alert and oriented. Blood pressure does not drop with change in position but pulse increases with standing.       Assessment & Plan:  Left mouth droop  Left facial numbness  Ataxia  Elevated blood pressure  Left sixth nerve palsy  Plan: Patient will be sent immediately to emergency department for further  evaluation. He will be taken out of work this week and next week. I am concerned he has intracranial pathology such as tumor or stroke. He will need imaging of his brain as soon as possible.

## 2016-07-29 NOTE — ED Notes (Signed)
Pt ambulatory to bathroom without any problems 

## 2016-07-29 NOTE — ED Notes (Signed)
Pt informed of delay. Pt and family are unhappy rt delay.

## 2016-07-29 NOTE — ED Notes (Signed)
Patient transported to MRI 
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
## 2016-07-29 NOTE — ED Notes (Signed)
Called MRI again and spoke with Victorino DikeJennifer. She states she will call transport and see what the hold up is.

## 2016-07-29 NOTE — ED Notes (Signed)
Called MRI and spoke with Meagan. She states they sent for transport 30 min ago. No one has come to get the pt.
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
## 2016-07-29 NOTE — ED Notes (Signed)
AD's informed of MRI delay and this RN requested they speak with the pt. Lurena Joinerebecca, AD spoke with family and apologized for the delay in care.

## 2016-07-30 ENCOUNTER — Observation Stay (HOSPITAL_COMMUNITY): Payer: 59

## 2016-07-30 DIAGNOSIS — R262 Difficulty in walking, not elsewhere classified: Secondary | ICD-10-CM | POA: Diagnosis present

## 2016-07-30 DIAGNOSIS — R937 Abnormal findings on diagnostic imaging of other parts of musculoskeletal system: Secondary | ICD-10-CM | POA: Diagnosis not present

## 2016-07-30 DIAGNOSIS — R930 Abnormal findings on diagnostic imaging of skull and head, not elsewhere classified: Secondary | ICD-10-CM | POA: Diagnosis not present

## 2016-07-30 DIAGNOSIS — R29810 Facial weakness: Secondary | ICD-10-CM | POA: Diagnosis present

## 2016-07-30 DIAGNOSIS — Z862 Personal history of diseases of the blood and blood-forming organs and certain disorders involving the immune mechanism: Secondary | ICD-10-CM | POA: Diagnosis not present

## 2016-07-30 DIAGNOSIS — I10 Essential (primary) hypertension: Secondary | ICD-10-CM | POA: Diagnosis not present

## 2016-07-30 DIAGNOSIS — R911 Solitary pulmonary nodule: Secondary | ICD-10-CM | POA: Diagnosis not present

## 2016-07-30 DIAGNOSIS — D8689 Sarcoidosis of other sites: Secondary | ICD-10-CM | POA: Diagnosis not present

## 2016-07-30 DIAGNOSIS — D860 Sarcoidosis of lung: Secondary | ICD-10-CM | POA: Diagnosis not present

## 2016-07-30 DIAGNOSIS — F17220 Nicotine dependence, chewing tobacco, uncomplicated: Secondary | ICD-10-CM | POA: Diagnosis present

## 2016-07-30 DIAGNOSIS — G936 Cerebral edema: Secondary | ICD-10-CM | POA: Diagnosis not present

## 2016-07-30 DIAGNOSIS — H532 Diplopia: Secondary | ICD-10-CM | POA: Diagnosis not present

## 2016-07-30 DIAGNOSIS — R2681 Unsteadiness on feet: Secondary | ICD-10-CM | POA: Diagnosis not present

## 2016-07-30 DIAGNOSIS — H4922 Sixth [abducent] nerve palsy, left eye: Secondary | ICD-10-CM | POA: Diagnosis not present

## 2016-07-30 DIAGNOSIS — R4701 Aphasia: Secondary | ICD-10-CM | POA: Diagnosis not present

## 2016-07-30 LAB — HIV ANTIBODY (ROUTINE TESTING W REFLEX): HIV SCREEN 4TH GENERATION: NONREACTIVE

## 2016-07-30 NOTE — Progress Notes (Signed)
Patient arrived from ED around 0100, alert and oriented his wife was with him, minor facial droop, all other neurological symptoms had resolved, oriented he and his wife to the room his orders include addlib ambulation, will continue to monitor.

## 2016-07-30 NOTE — Progress Notes (Signed)
PROGRESS NOTE    Adrian HornsMarcus D Simerson   ZOX:[id number]  DOB: 10/13/1970  DOA: 07/29/2016 PCP: Margaree MackintoshBaxley, Mary J, MD   Brief Narrative:  46 y/o male with HTN who developed left sided double vision about 2 wks ago followed by trouble speaking, drooling. 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 According to the patient, about 15 yrs ago, he developed significant lower extremity weakness and pain in his knee joints. He [place] see [place] in [place] who ordered a needle biopsy in his upper chest area (he is not sure if this was a lung biopsy). This biopsy showed Sarcoidosis and he was told that it would eventually go away. He did not receive treatment and symptoms resolved.   Subjective: No change in prior neurological symptoms. He tells me that he has been off balance at home. No new complaints.   Assessment & Plan:   Active Problems:   Multiple lesions on CT of brain and spine - prior h/o Sarcoidosis and current MRI brain showing numerous contrast enhancing lesions with surrounding edema  - high dose steroids initiated for neurosarcoid - LP per neurology - ambulate with walker  HTN - cont Norvasc   DVT prophylaxis: SCDs Code Status: Full code Family Communication: wife Disposition Plan:  Consultants:   neurology Procedures:    Antimicrobials:  Anti-infectives    None       Objective: Vitals:   07/29/16 2200 07/30/16 0044 07/30/16 0622 07/30/16 0848  BP:  129/85 130/76 (!) 150/99  Pulse:  77 82 72  Resp:  18 18 16   Temp:  97.3 F (36.3 C) 97.5 F (36.4 C) 97.5 F (36.4 C)  TempSrc:  Oral Oral Oral  SpO2: 95% 100%  96%  Weight:      Height:        Intake/Output Summary (Last 24 hours) at 07/30/16 1405 Last data filed at 07/30/16 1100  Gross per 24 hour  Intake              418 ml  Output                0 ml  Net              418 ml   Filed Weights   07/29/16 1038  Weight: 112.5 kg (248 lb)     Examination: General exam: Appears comfortable  HEENT: PERRLA, oral mucosa moist, no sclera icterus or thrush Respiratory system: Clear to auscultation. Respiratory effort normal. Cardiovascular system: S1 & S2 heard, RRR.  No murmurs  Gastrointestinal system: Abdomen soft, non-tender, nondistended. Normal bowel sound. No organomegaly Central nervous system: Alert and oriented. Left lateral rectus nerve palsy, other cranial nerves intact, strength intact in extermities Extremities: No cyanosis, clubbing or edema Skin: No rashes or ulcers Psychiatry:  Mood & affect appropriate.   Data Reviewed: I have personally reviewed following labs and imaging studies  CBC:  Recent Labs Lab 07/29/16 1038 07/29/16 1102  WBC 6.9  --   NEUTROABS 4.3  --   HGB 14.4 13.9  HCT 42.3 41.0  MCV 91.0  --   PLT 246  --    Basic Metabolic Panel:  Recent Labs Lab 07/29/16 1038 07/29/16 1102  NA 138 139  K 3.7 3.7  CL 106 105  CO2 26  --   GLUCOSE 147* 135*  BUN 12 14  CREATININE 1.06 1.00  CALCIUM 8.7*  --  GFR: Estimated Creatinine Clearance: 119.6 mL/min (by C-G formula based on SCr of 1 mg/dL). Liver Function Tests:  Recent Labs Lab 07/29/16 1038  AST 23  ALT 33  ALKPHOS 50  BILITOT 0.8  PROT 6.3*  ALBUMIN 3.7   No results for input(s): LIPASE, AMYLASE in the last 168 hours. No results for input(s): AMMONIA in the last 168 hours. Coagulation Profile:  Recent Labs Lab 07/29/16 1038  INR 0.98   Cardiac Enzymes: No results for input(s): CKTOTAL, CKMB, CKMBINDEX, TROPONINI in the last 168 hours. BNP (last 3 results) No results for input(s): PROBNP in the last 8760 hours. HbA1C: No results for input(s): HGBA1C in the last 72 hours. CBG: No results for input(s): GLUCAP in the last 168 hours. Lipid Profile: No results for input(s): CHOL, HDL, LDLCALC, TRIG, CHOLHDL, LDLDIRECT in the last 72 hours. Thyroid Function Tests: No results for input(s): TSH, T4TOTAL,  FREET4, T3FREE, THYROIDAB in the last 72 hours. Anemia Panel: No results for input(s): VITAMINB12, FOLATE, FERRITIN, TIBC, IRON, RETICCTPCT in the last 72 hours. Urine analysis:    Component Value Date/Time   COLORURINE YELLOW 07/29/2016 1336   APPEARANCEUR CLEAR 07/29/2016 1336   LABSPEC 1.021 07/29/2016 1336   PHURINE 5.0 07/29/2016 1336   GLUCOSEU NEGATIVE 07/29/2016 1336   HGBUR NEGATIVE 07/29/2016 1336   BILIRUBINUR NEGATIVE 07/29/2016 1336   BILIRUBINUR neg 09/26/2014 1544   KETONESUR NEGATIVE 07/29/2016 1336   PROTEINUR NEGATIVE 07/29/2016 1336   UROBILINOGEN negative 09/26/2014 1544   NITRITE NEGATIVE 07/29/2016 1336   LEUKOCYTESUR NEGATIVE 07/29/2016 1336   Sepsis Labs: @LABRCNTIP (procalcitonin:4,lacticidven:4) )No results found for this or any previous visit (from the past 240 hour(s)).       Radiology Studies: Dg Chest 2 View  Result Date: 07/30/2016 CLINICAL DATA:  46 y/o M; 46 y/o M; suspicion for neurosarcoidosis. Looking for evidence of sarcoidosis on chest radiograph. EXAM: CHEST  2 VIEW COMPARISON:  None. FINDINGS: The heart size and mediastinal contours are within normal limits. Both lungs are clear. The visualized skeletal structures are unremarkable. IMPRESSION: No definite findings of sarcoidosis. CT of the chest has higher sensitivity for mediastinal adenopathy. Electronically Signed   By: Mitzi Hansen M.D.   On: 07/30/2016 00:18   Ct Head Wo Contrast  Result Date: 07/29/2016 CLINICAL DATA:  Dizziness, blurred vision EXAM: CT HEAD WITHOUT CONTRAST TECHNIQUE: Contiguous axial images were obtained from the base of the skull through the vertex without intravenous contrast. COMPARISON:  None available FINDINGS: Brain: Mild brain atrophy pattern. No acute intracranial hemorrhage, mass lesion, new infarction, midline shift, herniation, hydrocephalus, or extra-axial fluid collection. Normal gray-white matter differentiation. No focal mass effect or edema.  Cisterns are patent. No cerebellar abnormality. Vascular: No hyperdense vessel or unexpected calcification. Skull: Normal. Negative for fracture or focal lesion. Sinuses/Orbits: No acute finding. Other: None. IMPRESSION: Mild brain atrophy pattern. No acute intracranial abnormality by noncontrast CT. Electronically Signed   By: Judie Petit.  Shick M.D.   On: 07/29/2016 11:11   Mr Laqueta Jean ZO Contrast  Result Date: 07/29/2016 CLINICAL DATA:  Diplopia for 2 weeks. EXAM: MRI HEAD WITHOUT AND WITH CONTRAST TECHNIQUE: Multiplanar, multiecho pulse sequences of the brain and surrounding structures were obtained without and with intravenous contrast. CONTRAST:  20mL MULTIHANCE GADOBENATE DIMEGLUMINE 529 MG/ML IV SOLN COMPARISON:  Head CT 07/29/2016 FINDINGS: Brain: The midline structures are normal. There is no focal diffusion restriction to indicate acute infarct. There are multiple contrast-enhancing lesions associated hyperintense T2 weighted signal scattered throughout the brain, the  largest lesion is at the left periaqueductal gray matter measuring 9 x 9 mm, in the area of the oculomotor nucleus. Other lesions are located along the medial left temporal lobe, left parieto-occipital sulcus parasagittal superior left frontal lobe, left postcentral gyrus, right cerebellum and superior right frontal lobe. No intraparenchymal hematoma or chronic microhemorrhage. Brain volume is normal for age without age-advanced or lobar predominant atrophy. The dura is normal and there is no extra-axial collection. Vascular: Major intracranial arterial and venous sinus flow voids are preserved. Skull and upper cervical spine: The visualized skull base, calvarium, upper cervical spine and extracranial soft tissues are normal. Sinuses/Orbits: No fluid levels or advanced mucosal thickening. No mastoid effusion. Normal orbits. IMPRESSION: 1. Multiple contrast-enhancing lesions scattered throughout the brain, the largest of which is located in the  left periaqueductal gray matter, in the area of the oculomotor nerve nucleus, likely accounting for the reported visual symptoms. 2. Most of the lesions are cortical or leptomeningeal. The appearance is more suggestive of an infectious or inflammatory process rather than a neoplastic etiology. Infection; granulomatous disease, such as sarcoidosis; or demyelinating disease are all possibilities. CSF sampling is recommended. 3. Mild edema surrounding the lesions but no mass effect or midline shift. Electronically Signed   By: Deatra Robinson M.D.   On: 07/29/2016 19:47      Scheduled Meds: . amLODipine  5 mg Oral Daily  . pantoprazole  40 mg Oral Daily   Continuous Infusions: . methylPREDNISolone (SOLU-MEDROL) injection Stopped (07/30/16 1117)     LOS: 0 days    Time spent in minutes: 35    Calvert Cantor, MD [facility] Pager: [email or web address] Password [email or web address] 07/30/2016, 2:05 PM

## 2016-07-30 NOTE — Care Management Note (Signed)
Case Management Note  Patient Details  Name: [name] Grotz MRN: [id number] Date of Birth: 08/09/1970  Subjective/Objective:  Pt admitted with multiple brain and spine lesions. He is from home with spouse.                   Action/Plan: Currently plan is to return home when medially ready. MD please order PT/OT if needed. CM following.  Expected Discharge Date:                  Expected Discharge Plan:     In-House Referral:     Discharge planning Services     Post Acute Care Choice:    Choice offered to:     DME Arranged:    DME Agency:     HH Arranged:    HH Agency:     Status of Service:  In process, will continue to follow  If discussed at Long Length of Stay Meetings, dates discussed:    Additional Comments:  [name] BaloKelli F Victoria Euceda, RN 07/30/2016, 3:47 PM

## 2016-07-31 ENCOUNTER — Inpatient Hospital Stay (HOSPITAL_COMMUNITY): Payer: 59

## 2016-07-31 LAB — ANGIOTENSIN CONVERTING ENZYME: Angiotensin-Converting Enzyme: 55 U/L (ref 14–82)

## 2016-07-31 MED ORDER — IOPAMIDOL (ISOVUE-300) INJECTION 61%
INTRAVENOUS | Status: AC
  Administered 2016-07-31: 100 mL via INTRAVENOUS
  Filled 2016-07-31: qty 100

## 2016-07-31 NOTE — Progress Notes (Signed)
PROGRESS NOTE    Adrian [name]   WUJ:[id number]  DOB: [birth year]  DOA: 07/29/2016 PCP: Margaree Mackintosh, MD   Brief Narrative:  46 y/o male with HTN who developed left sided double vision about 2 wks ago followed by trouble speaking, drooling. 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 According to the patient, about 15 yrs ago, he developed significant lower extremity weakness and pain in his knee joints. He went to see an oncologist in [place] who ordered a needle biopsy in his upper chest area (he is not sure if this was a lung biopsy). This biopsy showed Sarcoidosis and he was told that it would eventually go away. He did not receive treatment and symptoms resolved.   Subjective: No change in prior neurological symptoms. He tells me that he has been off balance at home. No new complaints.   Assessment & Plan:   Active Problems:   Multiple lesions on CT of brain and spine - left lateral rectus weakness and gait difficulty - prior h/o Sarcoidosis and current MRI brain showing numerous contrast enhancing lesions with surrounding edema  - high dose steroids initiated for neurosarcoid - LP per neurology - ambulate with walker  HTN - cont Norvasc   DVT prophylaxis: SCDs Code Status: Full code Family Communication: wife Disposition Plan:  Consultants:   neurology Procedures:    Antimicrobials:  Anti-infectives    None       Objective: Vitals:   07/30/16 2000 07/31/16 0000 07/31/16 0400 07/31/16 1004  BP: (!) 152/92 (!) 148/84 136/82 (!) 148/88  Pulse: (!) 114 98 83 89  Resp: 20 20 20 20   Temp: 97.6 F (36.4 C) 98.3 F (36.8 C) 98.1 F (36.7 C) 97.8 F (36.6 C)  TempSrc: Oral Oral Oral Oral  SpO2: 96% 96% 97% 95%  Weight:      Height:        Intake/Output Summary (Last 24 hours) at 07/31/16 1334 Last data filed at 07/31/16 1029  Gross per 24 hour  Intake             1720 ml  Output                0  ml  Net             1720 ml   Filed Weights   07/29/16 1038  Weight: 112.5 kg (248 lb)    Examination: General exam: Appears comfortable  HEENT: PERRLA, oral mucosa moist, no sclera icterus or thrush Respiratory system: Clear to auscultation. Respiratory effort normal. Cardiovascular system: S1 & S2 heard, RRR.  No murmurs  Gastrointestinal system: Abdomen soft, non-tender, nondistended. Normal bowel sound. No organomegaly Central nervous system: Alert and oriented. Left lateral rectus nerve palsy, other cranial nerves intact, strength intact in extermities Extremities: No cyanosis, clubbing or edema Skin: No rashes or ulcers Psychiatry:  Mood & affect appropriate.   Data Reviewed: I have personally reviewed following labs and imaging studies  CBC:  Recent Labs Lab 07/29/16 1038 07/29/16 1102  WBC 6.9  --   NEUTROABS 4.3  --   HGB 14.4 13.9  HCT 42.3 41.0  MCV 91.0  --   PLT 246  --    Basic Metabolic Panel:  Recent Labs Lab 07/29/16 1038 07/29/16 1102  NA 138 139  K 3.7 3.7  CL 106 105  CO2 26  --   GLUCOSE 147* 135*  BUN 12 14  CREATININE 1.06 1.00  CALCIUM 8.7*  --    GFR: Estimated Creatinine Clearance: 119.6 mL/min (by C-G formula based on SCr of 1 mg/dL). Liver Function Tests:  Recent Labs Lab 07/29/16 1038  AST 23  ALT 33  ALKPHOS 50  BILITOT 0.8  PROT 6.3*  ALBUMIN 3.7   No results for input(s): LIPASE, AMYLASE in the last 168 hours. No results for input(s): AMMONIA in the last 168 hours. Coagulation Profile:  Recent Labs Lab 07/29/16 1038  INR 0.98   Cardiac Enzymes: No results for input(s): CKTOTAL, CKMB, CKMBINDEX, TROPONINI in the last 168 hours. BNP (last 3 results) No results for input(s): PROBNP in the last 8760 hours. HbA1C: No results for input(s): HGBA1C in the last 72 hours. CBG: No results for input(s): GLUCAP in the last 168 hours. Lipid Profile: No results for input(s): CHOL, HDL, LDLCALC, TRIG, CHOLHDL, LDLDIRECT  in the last 72 hours. Thyroid Function Tests: No results for input(s): TSH, T4TOTAL, FREET4, T3FREE, THYROIDAB in the last 72 hours. Anemia Panel: No results for input(s): VITAMINB12, FOLATE, FERRITIN, TIBC, IRON, RETICCTPCT in the last 72 hours. Urine analysis:    Component Value Date/Time   COLORURINE YELLOW 07/29/2016 1336   APPEARANCEUR CLEAR 07/29/2016 1336   LABSPEC 1.021 07/29/2016 1336   PHURINE 5.0 07/29/2016 1336   GLUCOSEU NEGATIVE 07/29/2016 1336   HGBUR NEGATIVE 07/29/2016 1336   BILIRUBINUR NEGATIVE 07/29/2016 1336   BILIRUBINUR neg 09/26/2014 1544   KETONESUR NEGATIVE 07/29/2016 1336   PROTEINUR NEGATIVE 07/29/2016 1336   UROBILINOGEN negative 09/26/2014 1544   NITRITE NEGATIVE 07/29/2016 1336   LEUKOCYTESUR NEGATIVE 07/29/2016 1336   Sepsis Labs: @LABRCNTIP (procalcitonin:4,lacticidven:4) )No results found for this or any previous visit (from the past 240 hour(s)).       Radiology Studies: Dg Chest 2 View  Result Date: 07/30/2016 CLINICAL DATA:  46 y/o M; 46 y/o M; suspicion for neurosarcoidosis. Looking for evidence of sarcoidosis on chest radiograph. EXAM: CHEST  2 VIEW COMPARISON:  None. FINDINGS: The heart size and mediastinal contours are within normal limits. Both lungs are clear. The visualized skeletal structures are unremarkable. IMPRESSION: No definite findings of sarcoidosis. CT of the chest has higher sensitivity for mediastinal adenopathy. Electronically Signed   By: Mitzi HansenLance  Furusawa-Stratton M.D.   On: 07/30/2016 00:18   Mr Laqueta JeanBrain W BJWo Contrast  Result Date: 07/29/2016 CLINICAL DATA:  Diplopia for 2 weeks. EXAM: MRI HEAD WITHOUT AND WITH CONTRAST TECHNIQUE: Multiplanar, multiecho pulse sequences of the brain and surrounding structures were obtained without and with intravenous contrast. CONTRAST:  20mL MULTIHANCE GADOBENATE DIMEGLUMINE 529 MG/ML IV SOLN COMPARISON:  Head CT 07/29/2016 FINDINGS: Brain: The midline structures are normal. There is no focal  diffusion restriction to indicate acute infarct. There are multiple contrast-enhancing lesions associated hyperintense T2 weighted signal scattered throughout the brain, the largest lesion is at the left periaqueductal gray matter measuring 9 x 9 mm, in the area of the oculomotor nucleus. Other lesions are located along the medial left temporal lobe, left parieto-occipital sulcus parasagittal superior left frontal lobe, left postcentral gyrus, right cerebellum and superior right frontal lobe. No intraparenchymal hematoma or chronic microhemorrhage. Brain volume is normal for age without age-advanced or lobar predominant atrophy. The dura is normal and there is no extra-axial collection. Vascular: Major intracranial arterial and venous sinus flow voids are preserved. Skull and upper cervical spine: The visualized skull base, calvarium, upper cervical spine and extracranial soft tissues are normal. Sinuses/Orbits: No fluid levels or advanced  mucosal thickening. No mastoid effusion. Normal orbits. IMPRESSION: 1. Multiple contrast-enhancing lesions scattered throughout the brain, the largest of which is located in the left periaqueductal gray matter, in the area of the oculomotor nerve nucleus, likely accounting for the reported visual symptoms. 2. Most of the lesions are cortical or leptomeningeal. The appearance is more suggestive of an infectious or inflammatory process rather than a neoplastic etiology. Infection; granulomatous disease, such as sarcoidosis; or demyelinating disease are all possibilities. CSF sampling is recommended. 3. Mild edema surrounding the lesions but no mass effect or midline shift. Electronically Signed   By: Deatra Robinson M.D.   On: 07/29/2016 19:47      Scheduled Meds: . amLODipine  5 mg Oral Daily  . pantoprazole  40 mg Oral Daily   Continuous Infusions: . methylPREDNISolone (SOLU-MEDROL) injection Stopped (07/31/16 1129)     LOS: 1 day    Time spent in minutes:  35    Calvert Cantor, MD [facility] Pager: [email or web address] Password [email or web address] 07/31/2016, 1:34 PM

## 2016-07-31 NOTE — Progress Notes (Signed)
Subjective: Interval History: Still has double vision.  No headache.  No weakness except in right leg.  No fever, neck stiffness, chills.  Right clavicular lymph node biopsy 15 years ago - no records.  Positive for sarcoid.    Objective: Vital signs in last 24 hours: Temp:  [97.6 F (36.4 C)-98.3 F (36.8 C)] 98.1 F (36.7 C) (06/30 0400) Pulse Rate:  [83-114] 83 (06/30 0400) Resp:  [20] 20 (06/30 0400) BP: (136-154)/(82-92) 136/82 (06/30 0400) SpO2:  [96 %-97 %] 97 % (06/30 0400)  Intake/Output from previous day: 06/29 0701 - 06/30 0700 In: 600 [P.O.:600] Out: -  Intake/Output this shift: Total I/O In: 480 [P.O.:480] Out: -  Nutritional status: Diet regular Room service appropriate? Yes; Fluid consistency: Thin  Neurologic Exam:   Awake, alert, fully oriented. Left lateral rectus palsy dramatic. Vertical and Medial gaze is normal in left eye.  No abnormalities in right eye. PERL. Face symmetrical. Tongue midline.   Strength 5/5 BUE and BLE. Coordination -intact on right and dysmetric on left.  Sensory -intact. Gait- limps a little.    Lab Results:  Recent Labs  07/29/16 1038 07/29/16 1102  WBC 6.9  --   HGB 14.4 13.9  HCT 42.3 41.0  PLT 246  --   NA 138 139  K 3.7 3.7  CL 106 105  CO2 26  --   GLUCOSE 147* 135*  BUN 12 14  CREATININE 1.06 1.00  CALCIUM 8.7*  --    Lipid Panel No results for input(s): CHOL, TRIG, HDL, CHOLHDL, VLDL, LDLCALC in the last 72 hours.  Studies/Results: Dg Chest 2 View  Result Date: 07/30/2016 CLINICAL DATA:  46 y/o M; 46 y/o M; suspicion for neurosarcoidosis. Looking for evidence of sarcoidosis on chest radiograph. EXAM: CHEST  2 VIEW COMPARISON:  None. FINDINGS: The heart size and mediastinal contours are within normal limits. Both lungs are clear. The visualized skeletal structures are unremarkable. IMPRESSION: No definite findings of sarcoidosis. CT of the chest has higher sensitivity for mediastinal adenopathy.  Electronically Signed   By: Mitzi Hansen M.D.   On: 07/30/2016 00:18   Ct Head Wo Contrast  Result Date: 07/29/2016 CLINICAL DATA:  Dizziness, blurred vision EXAM: CT HEAD WITHOUT CONTRAST TECHNIQUE: Contiguous axial images were obtained from the base of the skull through the vertex without intravenous contrast. COMPARISON:  None available FINDINGS: Brain: Mild brain atrophy pattern. No acute intracranial hemorrhage, mass lesion, new infarction, midline shift, herniation, hydrocephalus, or extra-axial fluid collection. Normal gray-white matter differentiation. No focal mass effect or edema. Cisterns are patent. No cerebellar abnormality. Vascular: No hyperdense vessel or unexpected calcification. Skull: Normal. Negative for fracture or focal lesion. Sinuses/Orbits: No acute finding. Other: None. IMPRESSION: Mild brain atrophy pattern. No acute intracranial abnormality by noncontrast CT. Electronically Signed   By: Judie Petit.  Shick M.D.   On: 07/29/2016 11:11   Mr Laqueta Jean ZO Contrast  Result Date: 07/29/2016 CLINICAL DATA:  Diplopia for 2 weeks. EXAM: MRI HEAD WITHOUT AND WITH CONTRAST TECHNIQUE: Multiplanar, multiecho pulse sequences of the brain and surrounding structures were obtained without and with intravenous contrast. CONTRAST:  20mL MULTIHANCE GADOBENATE DIMEGLUMINE 529 MG/ML IV SOLN COMPARISON:  Head CT 07/29/2016 FINDINGS: Brain: The midline structures are normal. There is no focal diffusion restriction to indicate acute infarct. There are multiple contrast-enhancing lesions associated hyperintense T2 weighted signal scattered throughout the brain, the largest lesion is at the left periaqueductal gray matter measuring 9 x 9 mm, in the area of the  oculomotor nucleus. Other lesions are located along the medial left temporal lobe, left parieto-occipital sulcus parasagittal superior left frontal lobe, left postcentral gyrus, right cerebellum and superior right frontal lobe. No intraparenchymal  hematoma or chronic microhemorrhage. Brain volume is normal for age without age-advanced or lobar predominant atrophy. The dura is normal and there is no extra-axial collection. Vascular: Major intracranial arterial and venous sinus flow voids are preserved. Skull and upper cervical spine: The visualized skull base, calvarium, upper cervical spine and extracranial soft tissues are normal. Sinuses/Orbits: No fluid levels or advanced mucosal thickening. No mastoid effusion. Normal orbits. IMPRESSION: 1. Multiple contrast-enhancing lesions scattered throughout the brain, the largest of which is located in the left periaqueductal gray matter, in the area of the oculomotor nerve nucleus, likely accounting for the reported visual symptoms. 2. Most of the lesions are cortical or leptomeningeal. The appearance is more suggestive of an infectious or inflammatory process rather than a neoplastic etiology. Infection; granulomatous disease, such as sarcoidosis; or demyelinating disease are all possibilities. CSF sampling is recommended. 3. Mild edema surrounding the lesions but no mass effect or midline shift. Electronically Signed   By: Deatra RobinsonKevin  Herman M.D.   On: 07/29/2016 19:47    Medications:  Scheduled: . amLODipine  5 mg Oral Daily  . 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  The radiologist is incorrect in that the most prominent lesion is in the posterolateral pons in the region of abducens nerve, not oculomotor nerve as he said.  This is causing the double vision.  The left frontal lesion is giving him some subjective right leg weakness, more prominent on walking.  The cerebellar lesions give him mild incoordination.    MS lesions is possible especially given age.  CSF may potentially distinguish that by assessing oligoclonal bands and IgG index.  CSF in neurosarcoidosis is very non-specific and can  range from normal to grossly abnormal.  I doubt any other type of CNS infectious process since no fever, altered mental status, or signs of meningismus.    I will start by doing a CT chest to see if there is an enlarged lymph node amenable to biopsy to prove it here.  If not, then I will consider doing a spinal tap.  The patient and his wife understand that CSF yield may be lower with recent steroid treatment.     LOS: 1 day   Weston SettleESHRAGHI, Princessa Lesmeister, MD 07/31/2016  9:53 AM

## 2016-07-31 NOTE — Progress Notes (Signed)
Pt has had uneventful day. OOB to bathroom and ambulate in hall with wife. Requires standby assist as pt drifts to right side. No changes in neurological assessment. Wife at bedside. Cont with plan of care

## 2016-07-31 NOTE — Evaluation (Signed)
Physical Therapy Evaluation and Discharge Patient Details Name: Adrian [name] MRN: [id number] DOB: 11-26-1970 Today's Date: 07/31/2016   History of Present Illness  Pt is a 46 y/o male admitted secondary to diplopia and CN VI nerve palsy. 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 PMH including but not limited to systemic sarcoidosis.  Clinical Impression  Pt presented supine in bed with HOB elevated, awake and willing to participate in therapy session.  Prior to admission, pt reported that he was independent with all functional mobility and ADLs. 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 Pt ambulated in hallway with use of RW and min guard for safety. Pt and pt's wife reported that they have been ambulating in hallway together with RW; pt's wife reporting providing stand-by assist for safety. Pt has all DME necessary at home. No further acute PT needs identified at this time. PT signing off.    Follow Up Recommendations Outpatient PT    Equipment Recommendations  None recommended by PT    Recommendations for Other Services       Precautions / Restrictions Precautions Precautions: Fall Restrictions Weight Bearing Restrictions: No      Mobility  Bed Mobility Overal bed mobility: Independent                Transfers Overall transfer level: Modified independent Equipment used: Rolling walker (2 wheeled)                Ambulation/Gait Ambulation/Gait assistance: Min guard Ambulation Distance (Feet): 200 Feet Assistive device: Rolling walker (2 wheeled) Gait Pattern/deviations: Step-through pattern;Drifts right/left Gait velocity: WFL Gait velocity interpretation: at or above normal speed for age/gender General Gait Details: mild instability but no LOB or need for physical assistance, min guard for safety  Stairs Stairs: Yes Stairs assistance: Min guard Stair Management: Two rails;Alternating  pattern;Forwards Number of Stairs: 2    Wheelchair Mobility    Modified Rankin (Stroke Patients Only)       Balance Overall balance assessment: Needs assistance Sitting-balance support: Feet supported Sitting balance-Leahy Scale: Good     Standing balance support: During functional activity;No upper extremity supported Standing balance-Leahy Scale: Fair                               Pertinent Vitals/Pain Pain Assessment: No/denies pain    Home Living Family/patient expects to be discharged to:: Private residence Living Arrangements: Spouse/significant other Available Help at Discharge: Family Type of Home: House Home Access: Stairs to enter Entrance Stairs-Rails: None Entrance Stairs-Number of Steps: 2 Home Layout: One level Home Equipment: Environmental consultant - 2 wheels;Cane - single point;Bedside commode;Shower seat      Prior Function Level of Independence: Independent         Comments: works full-time in Theatre manager at the [employer]        Extremity/Trunk Assessment   Upper Extremity Assessment Upper Extremity Assessment: Overall WFL for tasks assessed    Lower Extremity Assessment Lower Extremity Assessment: Overall WFL for tasks assessed    Cervical / Trunk Assessment Cervical / Trunk Assessment: Normal  Communication   Communication: No difficulties  Cognition Arousal/Alertness: Awake/alert Behavior During Therapy: WFL for tasks assessed/performed Overall Cognitive Status: Within Functional Limits for tasks assessed  General Comments      Exercises     Assessment/Plan    PT Assessment Patent does not need any further PT services;All further PT needs can be met in the next venue of care  PT Problem List Decreased balance;Decreased mobility;Decreased coordination;Decreased safety awareness       PT Treatment Interventions      PT Goals (Current goals  can be found in the Care Plan section)  Acute Rehab PT Goals Patient Stated Goal: return home, return to PLOF PT Goal Formulation: With patient/family Time For Goal Achievement: 08/14/16 Potential to Achieve Goals: Good    Frequency     Barriers to discharge        Co-evaluation               AM-PAC PT "6 Clicks" Daily Activity  Outcome Measure Difficulty turning over in bed (including adjusting bedclothes, sheets and blankets)?: None Difficulty moving from lying on back to sitting on the side of the bed? : None Difficulty sitting down on and standing up from a chair with arms (e.g., wheelchair, bedside commode, etc,.)?: None Help needed moving to and from a bed to chair (including a wheelchair)?: None Help needed walking in hospital room?: A Little Help needed climbing 3-5 steps with a railing? : A Little 6 Click Score: 22    End of Session Equipment Utilized During Treatment: Gait belt Activity Tolerance: Patient tolerated treatment well Patient left: in bed;with call bell/phone within reach;with family/visitor present Nurse Communication: Mobility status PT Visit Diagnosis: Unsteadiness on feet (R26.81);Other abnormalities of gait and mobility (R26.89)    Time: 1416-1430 PT Time Calculation (min) (ACUTE ONLY): 14 min   Charges:   PT Evaluation $PT Eval Moderate Complexity: 1 Procedure     PT G Codes:        Sherie Don, PT, DPT [place] 07/31/2016, 2:37 PM

## 2016-08-01 LAB — PROTEIN AND GLUCOSE, CSF
GLUCOSE CSF: 98 mg/dL — AB (ref 40–70)
Total  Protein, CSF: 36 mg/dL (ref 15–45)

## 2016-08-01 LAB — BASIC METABOLIC PANEL
Anion gap: 8 (ref 5–15)
BUN: 15 mg/dL (ref 6–20)
CHLORIDE: 105 mmol/L (ref 101–111)
CO2: 26 mmol/L (ref 22–32)
Calcium: 8.9 mg/dL (ref 8.9–10.3)
Creatinine, Ser: 1.02 mg/dL (ref 0.61–1.24)
Glucose, Bld: 219 mg/dL — ABNORMAL HIGH (ref 65–99)
POTASSIUM: 3.9 mmol/L (ref 3.5–5.1)
SODIUM: 139 mmol/L (ref 135–145)

## 2016-08-01 LAB — CSF CELL COUNT WITH DIFFERENTIAL
RBC COUNT CSF: 56 /mm3 — AB
RBC Count, CSF: 1 /mm3 — ABNORMAL HIGH
Tube #: 1
WBC CSF: 7 /mm3 — AB (ref 0–5)
WBC CSF: 4 /mm3 (ref 0–5)

## 2016-08-01 LAB — CBC
HEMATOCRIT: 39.4 % (ref 39.0–52.0)
Hemoglobin: 13.6 g/dL (ref 13.0–17.0)
MCH: 31.7 pg (ref 26.0–34.0)
MCHC: 34.5 g/dL (ref 30.0–36.0)
MCV: 91.8 fL (ref 78.0–100.0)
PLATELETS: 273 10*3/uL (ref 150–400)
RBC: 4.29 MIL/uL (ref 4.22–5.81)
RDW: 12.3 % (ref 11.5–15.5)
WBC: 18.7 10*3/uL — AB (ref 4.0–10.5)

## 2016-08-01 LAB — GLUCOSE, CAPILLARY: Glucose-Capillary: 267 mg/dL — ABNORMAL HIGH (ref 65–99)

## 2016-08-01 MED ORDER — SALINE SPRAY 0.65 % NA SOLN
1.0000 | NASAL | Status: DC | PRN
  Filled 2016-08-01 (×2): qty 44

## 2016-08-01 NOTE — Procedures (Signed)
Spinal Tap  Informed consent was obtained in writing and risks and benefits of the procedure were explained in detail.    Patient was placed in sitting position leaning forward.  The anterior superior iliac spines were palpated and traced back to the midline at L2-3 level.  The area was cleaned with betadine and sterilely draped.  1% lidocaine was given for local anesthesia.  Clear CSF was obtained in each of 4 tubes.    The patient tolerated the procedure well without any complications.  CSF will be sent for routine analysis as well as ACE, VDRL, lyme, OCB, IgG index, IgG synthesis rate.

## 2016-08-01 NOTE — Progress Notes (Signed)
Subjective: Interval History:  No change in diplopia.  No other symptoms.  CT chest did not show any sarcoid lesions to biopsy.  He is scheduled for 4th IV solumedrol dose today.  Objective: Vital signs in last 24 hours: Temp:  [97.5 F (36.4 C)-98.5 F (36.9 C)] 98.1 F (36.7 C) (07/01 0100) Pulse Rate:  [72-89] 72 (07/01 0100) Resp:  [18-20] 18 (07/01 0100) BP: (144-163)/(86-100) 162/96 (07/01 0100) SpO2:  [94 %-96 %] 95 % (07/01 0100)  Intake/Output from previous day: 06/30 0701 - 07/01 0700 In: 2440 [P.O.:1440; IV Piggyback:1000] Out: -  Intake/Output this shift: Total I/O In: 120 [P.O.:120] Out: -  Nutritional status: Diet regular Room service appropriate? Yes; Fluid consistency: Thin  Neurologic Exam:  Left eye has very little abduction.  He can do adduction and vertical movements.  Eye closure is weak on the left.    Right eye is normal.    Face symmetric.  Tongue midline.  Strength 5/5 BUE and BLE.  Lab Results:  Recent Labs  07/29/16 1038 07/29/16 1102 08/01/16 0227  WBC 6.9  --  18.7*  HGB 14.4 13.9 13.6  HCT 42.3 41.0 39.4  PLT 246  --  273  NA 138 139 139  K 3.7 3.7 3.9  CL 106 105 105  CO2 26  --  26  GLUCOSE 147* 135* 219*  BUN 12 14 15   CREATININE 1.06 1.00 1.02  CALCIUM 8.7*  --  8.9   Lipid Panel No results for input(s): CHOL, TRIG, HDL, CHOLHDL, VLDL, LDLCALC in the last 72 hours.  Studies/Results: Ct Chest W Contrast  Result Date: 07/31/2016 CLINICAL DATA:  Evaluate for sarcoidosis. EXAM: CT CHEST WITH CONTRAST TECHNIQUE: Multidetector CT imaging of the chest was performed during intravenous contrast administration. CONTRAST:  100mL ISOVUE-300 IOPAMIDOL (ISOVUE-300) INJECTION 61% COMPARISON:  Chest x-ray from July 29, 2016 FINDINGS: Cardiovascular: No significant vascular findings. Normal heart size. No pericardial effusion. Mediastinum/Nodes: No enlarged mediastinal, hilar, or axillary lymph nodes. Thyroid gland, trachea, and esophagus  demonstrate no significant findings. Lungs/Pleura: Central airways are normal. No pneumothorax. There is a 5 mm nodule in the left lung base on series 4, image 81. The lungs are otherwise normal. No other nodules, masses, or infiltrates. No CT evidence of sarcoidosis within the lungs. Upper Abdomen: No acute abnormality. Musculoskeletal: No chest wall abnormality. No acute or significant osseous findings. IMPRESSION: 1. No CT evidence of sarcoidosis in the chest. 2. 5 mm nodule in the left lung base. No follow-up needed if patient is low-risk. Non-contrast chest CT can be considered in 12 months if patient is high-risk. This recommendation follows the consensus statement: Guidelines for Management of Incidental Pulmonary Nodules Detected on CT Images: From the Fleischner Society 2017; Radiology 2017; 284:228-243. Electronically Signed   By: Gerome Samavid  Williams III M.D   On: 07/31/2016 21:11    Medications:  Scheduled: . amLODipine  5 mg Oral Daily  . 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  I could not find a pulmonary lesion to biopsy.  Therefore, I will do a CSF analysis later today.     LOS: 2 days   Weston SettleESHRAGHI, Johsua Shevlin, MD 08/01/2016  9:18 AM

## 2016-08-01 NOTE — Progress Notes (Addendum)
PROGRESS NOTE    Adrian [name]   ZOX:[id number]  DOB: 08/14/1970  DOA: 07/29/2016 PCP: Margaree Mackintosh, MD   Brief Narrative:  46 y/o male with HTN who developed left sided double vision about 2 wks ago followed by trouble speaking, drooling. 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 According to the patient, about 15 yrs ago, he developed significant lower extremity weakness and pain in his knee joints. He went to see an oncologist in [place] who ordered a needle biopsy in his upper chest area (he is not sure if this was a lung biopsy). This biopsy showed Sarcoidosis and he was told that it would eventually go away. He did not receive treatment and symptoms resolved.   Subjective: No change in neurological symptoms of diplopia and gait imbalance. Feels he has sinus congestion. No fevers, pain, dyspnea, cough, nausea or vomiting.   Assessment & Plan:   Active Problems:   Multiple lesions on CT of brain and spine - left lateral rectus weakness and gait difficulty - prior h/o Sarcoidosis and current MRI brain showing numerous contrast enhancing lesions with surrounding edema  - high dose steroids initiated for neurosarcoid - LP performed today by neuro - f/u results  -CT chest ordered to look for lung involvement is negative other than a small nodule - ambulate with walker  HTN - cont Norvasc   Lung nodule 2.5 mm in Left lung base- consider CT in 1 yr if his is high risk- Quit smoking > 10 yrs ago  DVT prophylaxis: SCDs Code Status: Full code Family Communication: wife Disposition Plan:  Consultants:   neurology Procedures:    Antimicrobials:  Anti-infectives    None       Objective: Vitals:   07/31/16 2135 08/01/16 0100 08/01/16 0947 08/01/16 1311  BP: (!) 151/100 (!) 162/96 (!) 143/79 100/68  Pulse: 77 72 82 74  Resp: 20 18 20 20   Temp: 98 F (36.7 C) 98.1 F (36.7 C) 98.3 F (36.8 C) 97.8 F (36.6  C)  TempSrc: Oral Oral Oral Oral  SpO2: 95% 95% 97% 96%  Weight:      Height:        Intake/Output Summary (Last 24 hours) at 08/01/16 1408 Last data filed at 08/01/16 1312  Gross per 24 hour  Intake             1080 ml  Output                0 ml  Net             1080 ml   Filed Weights   07/29/16 1038  Weight: 112.5 kg (248 lb)    Examination: General exam: Appears comfortable  HEENT: PERRLA, oral mucosa moist, no sclera icterus or thrush Respiratory system: Clear to auscultation. Respiratory effort normal. Cardiovascular system: S1 & S2 heard, RRR.  No murmurs  Gastrointestinal system: Abdomen soft, non-tender, nondistended. Normal bowel sound. No organomegaly Central nervous system: Alert and oriented. Left lateral rectus nerve palsy, other cranial nerves intact, strength intact in extermities Extremities: No cyanosis, clubbing or edema Skin: No rashes or ulcers Psychiatry:  Mood & affect appropriate.  No change in exam from 6/30  Data Reviewed: I have personally reviewed following labs and imaging studies  CBC:  Recent Labs Lab 07/29/16 1038 07/29/16 1102 08/01/16 0227  WBC 6.9  --  18.7*  NEUTROABS 4.3  --   --  HGB 14.4 13.9 13.6  HCT 42.3 41.0 39.4  MCV 91.0  --  91.8  PLT 246  --  273   Basic Metabolic Panel:  Recent Labs Lab 07/29/16 1038 07/29/16 1102 08/01/16 0227  NA 138 139 139  K 3.7 3.7 3.9  CL 106 105 105  CO2 26  --  26  GLUCOSE 147* 135* 219*  BUN 12 14 15   CREATININE 1.06 1.00 1.02  CALCIUM 8.7*  --  8.9   GFR: Estimated Creatinine Clearance: 117.2 mL/min (by C-G formula based on SCr of 1.02 mg/dL). Liver Function Tests:  Recent Labs Lab 07/29/16 1038  AST 23  ALT 33  ALKPHOS 50  BILITOT 0.8  PROT 6.3*  ALBUMIN 3.7   No results for input(s): LIPASE, AMYLASE in the last 168 hours. No results for input(s): AMMONIA in the last 168 hours. Coagulation Profile:  Recent Labs Lab 07/29/16 1038  INR 0.98   Cardiac  Enzymes: No results for input(s): CKTOTAL, CKMB, CKMBINDEX, TROPONINI in the last 168 hours. BNP (last 3 results) No results for input(s): PROBNP in the last 8760 hours. HbA1C: No results for input(s): HGBA1C in the last 72 hours. CBG: No results for input(s): GLUCAP in the last 168 hours. Lipid Profile: No results for input(s): CHOL, HDL, LDLCALC, TRIG, CHOLHDL, LDLDIRECT in the last 72 hours. Thyroid Function Tests: No results for input(s): TSH, T4TOTAL, FREET4, T3FREE, THYROIDAB in the last 72 hours. Anemia Panel: No results for input(s): VITAMINB12, FOLATE, FERRITIN, TIBC, IRON, RETICCTPCT in the last 72 hours. Urine analysis:    Component Value Date/Time   COLORURINE YELLOW 07/29/2016 1336   APPEARANCEUR CLEAR 07/29/2016 1336   LABSPEC 1.021 07/29/2016 1336   PHURINE 5.0 07/29/2016 1336   GLUCOSEU NEGATIVE 07/29/2016 1336   HGBUR NEGATIVE 07/29/2016 1336   BILIRUBINUR NEGATIVE 07/29/2016 1336   BILIRUBINUR neg 09/26/2014 1544   KETONESUR NEGATIVE 07/29/2016 1336   PROTEINUR NEGATIVE 07/29/2016 1336   UROBILINOGEN negative 09/26/2014 1544   NITRITE NEGATIVE 07/29/2016 1336   LEUKOCYTESUR NEGATIVE 07/29/2016 1336   Sepsis Labs: @LABRCNTIP (procalcitonin:4,lacticidven:4) ) Recent Results (from the past 240 hour(s))  CSF culture     Status: None (Preliminary result)   Collection Time: 08/01/16 11:09 AM  Result Value Ref Range Status   Specimen Description CSF  Final   Special Requests NONE  Final   Gram Stain   Final    CYTOSPIN SMEAR WBC PRESENT, PREDOMINANTLY MONONUCLEAR NO ORGANISMS SEEN    Culture PENDING  Incomplete   Report Status PENDING  Incomplete         Radiology Studies: Ct Chest W Contrast  Result Date: 07/31/2016 CLINICAL DATA:  Evaluate for sarcoidosis. EXAM: CT CHEST WITH CONTRAST TECHNIQUE: Multidetector CT imaging of the chest was performed during intravenous contrast administration. CONTRAST:  100mL ISOVUE-300 IOPAMIDOL (ISOVUE-300) INJECTION  61% COMPARISON:  Chest x-ray from July 29, 2016 FINDINGS: Cardiovascular: No significant vascular findings. Normal heart size. No pericardial effusion. Mediastinum/Nodes: No enlarged mediastinal, hilar, or axillary lymph nodes. Thyroid gland, trachea, and esophagus demonstrate no significant findings. Lungs/Pleura: Central airways are normal. No pneumothorax. There is a 5 mm nodule in the left lung base on series 4, image 81. The lungs are otherwise normal. No other nodules, masses, or infiltrates. No CT evidence of sarcoidosis within the lungs. Upper Abdomen: No acute abnormality. Musculoskeletal: No chest wall abnormality. No acute or significant osseous findings. IMPRESSION: 1. No CT evidence of sarcoidosis in the chest. 2. 5 mm nodule in the left lung  base. No follow-up needed if patient is low-risk. Non-contrast chest CT can be considered in 12 months if patient is high-risk. This recommendation follows the consensus statement: Guidelines for Management of Incidental Pulmonary Nodules Detected on CT Images: From the Fleischner Society 2017; Radiology 2017; 284:228-243. Electronically Signed   By: Gerome Sam III M.D   On: 07/31/2016 21:11      Scheduled Meds: . amLODipine  5 mg Oral Daily  . pantoprazole  40 mg Oral Daily   Continuous Infusions: . methylPREDNISolone (SOLU-MEDROL) injection Stopped (08/01/16 1108)     LOS: 2 days    Time spent in minutes: 35    Calvert Cantor, MD [facility] Pager: [email or web address] Password [email or web address] 08/01/2016, 2:08 PM

## 2016-08-02 DIAGNOSIS — R2681 Unsteadiness on feet: Secondary | ICD-10-CM

## 2016-08-02 DIAGNOSIS — H4922 Sixth [abducent] nerve palsy, left eye: Principal | ICD-10-CM

## 2016-08-02 LAB — GLUCOSE, CAPILLARY: GLUCOSE-CAPILLARY: 120 mg/dL — AB (ref 65–99)

## 2016-08-02 MED ORDER — RANITIDINE HCL 150 MG PO TABS: 150.0000 mg | ORAL_TABLET | Freq: Two times a day (BID) | ORAL | 0 refills | Status: DC | PRN

## 2016-08-02 MED ORDER — PREDNISONE 20 MG PO TABS: 80.0000 mg | ORAL_TABLET | Freq: Every day | ORAL | 0 refills | Status: DC

## 2016-08-02 MED ORDER — OMEPRAZOLE 40 MG PO CPDR: 40.0000 mg | DELAYED_RELEASE_CAPSULE | Freq: Every day | ORAL | 1 refills | Status: DC

## 2016-08-02 NOTE — Progress Notes (Signed)
Patient given discharge instructions. Spouse at bedside.  All questions and concerns addressed.  Patient taken out to private car by volunteer services.

## 2016-08-02 NOTE — Care Management Note (Signed)
Case Management Note  Patient Details  Name: Adrian [name] MRN: [id number] Date of Birth: 1971/01/16  Subjective/Objective:                    Action/Plan: Pt discharging home with self care. PT recommending outpatient therapy. CM met with the patient and his wife and they are currently refusing outpatient therapy.  Wife to transport home. No further needs per CM.   Expected Discharge Date:  08/02/16               Expected Discharge Plan:  Home/Self Care  In-House Referral:     Discharge planning Services  CM Consult  Post Acute Care Choice:    Choice offered to:     DME Arranged:    DME Agency:     HH Arranged:    HH Agency:     Status of Service:  Completed, signed off  If discussed at [employer] of Stay Meetings, dates discussed:    Additional Comments:  Pollie Friar, RN 08/02/2016, 1:06 PM

## 2016-08-02 NOTE — Discharge Summary (Signed)
Physician Discharge Summary  Adrian [name] OAC:[id number] DOB: 07-28-1970 DOA: 07/29/2016  PCP: Margaree Mackintosh, MD  Admit date: 07/29/2016 Discharge date: 08/02/2016  Admitted From: home Disposition:  hom   Recommendations for Outpatient Follow-up:  1. Needs close neuro follow up- appointment requested  Home Health:  no Equipment/Devices: walker  Discharge Condition:  stable   CODE STATUS:  Full code   Consultations:  Neurology    Discharge Diagnoses:  Active Problems:   Essential hypertension   6th nerve palsy, left   Multiple lesions on CT of brain and spine   Gait instability     Subjective: Tells me that his vision and gait have not changed when compared to yesterday. No issues with dyspnea, cough, constipation, diarrhea, nausea or vomiting. Has been quite hungry from the steroids.  Brief Summary: 46 y/o male with HTN who developed left sided double vision about 2 wks ago followed by trouble speaking, drooling. 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 According to the patient, about 15 yrs ago, he developed significant lower extremity weakness and pain in his knee joints. He went to see an oncologist in [place] who ordered a needle biopsy in his upper chest area (he is not sure if this was a lung biopsy). This biopsy showed Sarcoidosis and he was told that it would eventually go away. He did not receive treatment and symptoms resolved.    Hospital Course:  Active Problems:   Multiple lesions on CT of brain and spine - left lateral rectus weakness and gait difficulty - prior h/o Sarcoidosis and current MRI brain showing numerous contrast enhancing lesions with surrounding edema suggestive of neuro - sarcoid - LP performed by neuro - no infectious finding- neuro to f/u on further studies that they have ordred including VDRL, IgG CSF index, Oligoclonal bands - HIV non reactive - ACE level 55 -CT chest ordered to  look for lung involvement is negative for sarcoid-  a small nodule noted - see below - high dose steroids initiated for neurosarcoidoses and given for 5 days - has been transitioned to Prednisone today at 80 mg daily until he is evaluated by outpt Neuro - ambulating with walker in hall independently  - watch for hyperacidity, weight and fluid gain with steroids  HTN - cont Norvasc   Lung nodule 2.5 mm in Left lung base- consider CT in 1 yr if his is high risk- Quit smoking > 10 yrs ago  Discharge Instructions  Discharge Instructions    Ambulatory referral to Neurology    Complete by:  As directed    An appointment is requested in approximately: 2 weeks   Ambulatory referral to Neurology    Complete by:  As directed    An appointment is requested in approximately: 2 weeks  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   omeprazole 40 MG capsule Commonly  known as:  PRILOSEC Take 1 capsule (40 mg total) by mouth daily. Start only if your are needing to take Zantac frequently and still having heartburn   ranitidine 150 MG tablet Commonly known as:  ZANTAC Take 1 tablet (150 mg total) by mouth 2 (two) times daily as needed for heartburn.      Follow-up Information    [facility] Follow up.   Specialty:  Neurology Contact information: [address] [place] [address] [phone]         No Known Allergies   Procedures/Studies: 7/1- LP - Dr Nicholas Lose  Dg Chest 2 View  Result Date: 07/30/2016 CLINICAL DATA:   47 y/o M; 46 y/o M; suspicion for neurosarcoidosis. Looking for evidence of sarcoidosis on chest radiograph. EXAM: CHEST  2 VIEW COMPARISON:  None. FINDINGS: The heart size and mediastinal contours are within normal limits. Both lungs are clear. The visualized skeletal structures are unremarkable. IMPRESSION: No definite findings of sarcoidosis. CT of the chest has higher sensitivity for mediastinal adenopathy. Electronically Signed   By: Mitzi Hansen M.D.   On: 07/30/2016 00:18   Ct Head Wo Contrast  Result Date: 07/29/2016 CLINICAL DATA:  Dizziness, blurred vision EXAM: CT HEAD WITHOUT CONTRAST TECHNIQUE: Contiguous axial images were obtained from the base of the skull through the vertex without intravenous contrast. COMPARISON:  None available FINDINGS: Brain: Mild brain atrophy pattern. No acute intracranial hemorrhage, mass lesion, new infarction, midline shift, herniation, hydrocephalus, or extra-axial fluid collection. Normal gray-white matter differentiation. No focal mass effect or edema. Cisterns are patent. No cerebellar abnormality. Vascular: No hyperdense vessel or unexpected calcification. Skull: Normal. Negative for fracture or focal lesion. Sinuses/Orbits: No acute finding. Other: None. IMPRESSION: Mild brain atrophy pattern. No acute intracranial abnormality by noncontrast CT. Electronically Signed   By: Judie Petit.  Shick M.D.   On: 07/29/2016 11:11   Ct Chest W Contrast  Result Date: 07/31/2016 CLINICAL DATA:  Evaluate for sarcoidosis. EXAM: CT CHEST WITH CONTRAST TECHNIQUE: Multidetector CT imaging of the chest was performed during intravenous contrast administration. CONTRAST:  ISOVUE-300 IOPAMIDOL (ISOVUE-300) INJECTION 61% COMPARISON:  Chest x-ray from July 29, 2016 FINDINGS: Cardiovascular: No significant vascular findings. Normal heart size. No pericardial effusion. Mediastinum/Nodes: No enlarged mediastinal, hilar, or axillary lymph nodes. Thyroid gland, trachea, and  esophagus demonstrate no significant findings. Lungs/Pleura: Central airways are normal. No pneumothorax. There is a 5 mm nodule in the left lung base on series 4, image 81. The lungs are otherwise normal. No other nodules, masses, or infiltrates. No CT evidence of sarcoidosis within the lungs. Upper Abdomen: No acute abnormality. Musculoskeletal: No chest wall abnormality. No acute or significant osseous findings. IMPRESSION: 1. No CT evidence of sarcoidosis in the chest. 2. 5 mm nodule in the left lung base. No follow-up needed if patient is low-risk. Non-contrast chest CT can be considered in 12 months if patient is high-risk. This recommendation follows the consensus statement: Guidelines for Management of Incidental Pulmonary Nodules Detected on CT Images: From the Fleischner Society 2017; Radiology 2017; 284:228-243. Electronically Signed   By: Gerome Sam III M.D   On: 07/31/2016 21:11   Mr Brain W Wo Contrast  Result Date: 07/29/2016 CLINICAL DATA:  Diplopia for 2 weeks. EXAM: MRI HEAD WITHOUT AND WITH CONTRAST TECHNIQUE: Multiplanar, multiecho pulse sequences of the brain and surrounding structures were obtained without and with intravenous contrast. CONTRAST:  20mL MULTIHANCE GADOBENATE DIMEGLUMINE 529 MG/ML IV SOLN COMPARISON:  Head CT 07/29/2016 FINDINGS: Brain:  The midline structures are normal. There is no focal diffusion restriction to indicate acute infarct. There are multiple contrast-enhancing lesions associated hyperintense T2 weighted signal scattered throughout the brain, the largest lesion is at the left periaqueductal gray matter measuring 9 x 9 mm, in the area of the oculomotor nucleus. Other lesions are located along the medial left temporal lobe, left parieto-occipital sulcus parasagittal superior left frontal lobe, left postcentral gyrus, right cerebellum and superior right frontal lobe. No intraparenchymal hematoma or chronic microhemorrhage. Brain volume is normal for age without  age-advanced or lobar predominant atrophy. The dura is normal and there is no extra-axial collection. Vascular: Major intracranial arterial and venous sinus flow voids are preserved. Skull and upper cervical spine: The visualized skull base, calvarium, upper cervical spine and extracranial soft tissues are normal. Sinuses/Orbits: No fluid levels or advanced mucosal thickening. No mastoid effusion. Normal orbits. IMPRESSION: 1. Multiple contrast-enhancing lesions scattered throughout the brain, the largest of which is located in the left periaqueductal gray matter, in the area of the oculomotor nerve nucleus, likely accounting for the reported visual symptoms. 2. Most of the lesions are cortical or leptomeningeal. The appearance is more suggestive of an infectious or inflammatory process rather than a neoplastic etiology. Infection; granulomatous disease, such as sarcoidosis; or demyelinating disease are all possibilities. CSF sampling is recommended. 3. Mild edema surrounding the lesions but no mass effect or midline shift. Electronically Signed   By: Deatra RobinsonKevin  Herman M.D.   On: 07/29/2016 19:47       Discharge Exam: Vitals:   08/01/16 2145 08/02/16 0100  BP: (!) 157/88 (!) 146/83  Pulse: 82 66  Resp: 20 20  Temp: 98.8 F (37.1 C) 98.2 F (36.8 C)   Vitals:   08/01/16 1311 08/01/16 1700 08/01/16 2145 08/02/16 0100  BP: 100/68 138/83 (!) 157/88 (!) 146/83  Pulse: 74 73 82 66  Resp: 20 20 20 20   Temp: 97.8 F (36.6 C) 98.4 F (36.9 C) 98.8 F (37.1 C) 98.2 F (36.8 C)  TempSrc: Oral Oral Oral Oral  SpO2: 96% 97% 96% 96%  Weight:      Height:        General: Pt is alert, awake, not in acute distress Cardiovascular: RRR, S1/S2 +, no rubs, no gallops Respiratory: CTA bilaterally, no wheezing, no rhonchi Abdominal: Soft, NT, ND, bowel sounds + Extremities: no edema, no cyanosis    The results of significant diagnostics from this hospitalization (including imaging, microbiology,  ancillary and laboratory) are listed below for reference.     Microbiology: Recent Results (from the past 240 hour(s))  CSF culture     Status: None (Preliminary result)   Collection Time: 08/01/16 11:09 AM  Result Value Ref Range Status   Specimen Description CSF  Final   Special Requests NONE  Final   Gram Stain   Final    CYTOSPIN SMEAR WBC PRESENT, PREDOMINANTLY MONONUCLEAR NO ORGANISMS SEEN    Culture NO GROWTH 1 DAY  Final   Report Status PENDING  Incomplete     Labs: BNP (last 3 results) No results for input(s): BNP in the last 8760 hours. Basic Metabolic Panel:  Recent Labs Lab 07/29/16 1038 07/29/16 1102 08/01/16 0227  NA 138 139 139  K 3.7 3.7 3.9  CL 106 105 105  CO2 26  --  26  GLUCOSE 147* 135* 219*  BUN 12 14 15   CREATININE 1.06 1.00 1.02  CALCIUM 8.7*  --  8.9   Liver Function Tests:  Recent  Labs Lab 07/29/16 1038  AST 23  ALT 33  ALKPHOS 50  BILITOT 0.8  PROT 6.3*  ALBUMIN 3.7   No results for input(s): LIPASE, AMYLASE in the last 168 hours. No results for input(s): AMMONIA in the last 168 hours. CBC:  Recent Labs Lab 07/29/16 1038 07/29/16 1102 08/01/16 0227  WBC 6.9  --  18.7*  NEUTROABS 4.3  --   --   HGB 14.4 13.9 13.6  HCT 42.3 41.0 39.4  MCV 91.0  --  91.8  PLT 246  --  273   Cardiac Enzymes: No results for input(s): CKTOTAL, CKMB, CKMBINDEX, TROPONINI in the last 168 hours. BNP: Invalid input(s): POCBNP CBG:  Recent Labs Lab 08/01/16 1632 08/02/16 0745  GLUCAP 267* 120*   D-Dimer No results for input(s): DDIMER in the last 72 hours. Hgb A1c No results for input(s): HGBA1C in the last 72 hours. Lipid Profile No results for input(s): CHOL, HDL, LDLCALC, TRIG, CHOLHDL, LDLDIRECT in the last 72 hours. Thyroid function studies No results for input(s): TSH, T4TOTAL, T3FREE, THYROIDAB in the last 72 hours.  Invalid input(s): FREET3 Anemia work up No results for input(s): VITAMINB12, FOLATE, FERRITIN, TIBC, IRON,  RETICCTPCT in the last 72 hours. Urinalysis    Component Value Date/Time   COLORURINE YELLOW 07/29/2016 1336   APPEARANCEUR CLEAR 07/29/2016 1336   LABSPEC 1.021 07/29/2016 1336   PHURINE 5.0 07/29/2016 1336   GLUCOSEU NEGATIVE 07/29/2016 1336   HGBUR NEGATIVE 07/29/2016 1336   BILIRUBINUR NEGATIVE 07/29/2016 1336   BILIRUBINUR neg 09/26/2014 1544   KETONESUR NEGATIVE 07/29/2016 1336   PROTEINUR NEGATIVE 07/29/2016 1336   UROBILINOGEN negative 09/26/2014 1544   NITRITE NEGATIVE 07/29/2016 1336   LEUKOCYTESUR NEGATIVE 07/29/2016 1336   Sepsis Labs Invalid input(s): PROCALCITONIN,  WBC,  LACTICIDVEN Microbiology Recent Results (from the past 240 hour(s))  CSF culture     Status: None (Preliminary result)   Collection Time: 08/01/16 11:09 AM  Result Value Ref Range Status   Specimen Description CSF  Final   Special Requests NONE  Final   Gram Stain   Final    CYTOSPIN SMEAR WBC PRESENT, PREDOMINANTLY MONONUCLEAR NO ORGANISMS SEEN    Culture NO GROWTH 1 DAY  Final   Report Status PENDING  Incomplete     Time coordinating discharge: Over 30 minutes  SIGNED:   Calvert Cantor, MD  [facility] 08/02/2016, 12:30 PM Pager   If 7PM-7AM, please contact night-coverage [email or web address] Password [email or web address]

## 2016-08-02 NOTE — Discharge Instructions (Signed)
Diplopia Diplopia is the condition of having double vision or seeing two of a single object. There are many causes of diplopia. Some are not dangerous and can be easily corrected. Diplopia may also be a symptom of a serious medical problem. There are two types of diplopia.  Monocular diplopia. This is double vision that affects only one eye. Monocular diplopia is often caused by a clouding of the lens in your eye (cataract) or by disruptions in the way that your eye focuses light.  Binocular diplopia. This is double vision that affects both eyes. However, when you shut one eye, the double vision will go away. Binocular diplopia may be more serious. It can be caused by: ? Problems with the nerves or muscles that are responsible for eye movement. ? Neurologic diseases. ? Thyroid problems. ? Tumors. ? An infection near your eyes. ? A stroke.  You may need to see a health care provider who specializes in eye conditions (ophthalmologist) or a nerve specialist (neurologist) to find the cause. Follow these instructions at home:  Tell your health care provider about any changes in your vision.  Do not drive or operate heavy machinery if diplopia interferes with your vision.  Keep all follow-up visits as directed by your health care provider. This is important. Contact a health care provider if:  Your diplopia gets worse.  You develop any other symptoms along with your diplopia, such as: ? Weakness. ? Numbness. ? Headache. ? Eye pain. ? Clumsiness. ? Nausea. ? Drooping eyelids. ? Abnormal movement of one of your eyes. Get help right away if:  You have sudden vision loss.  You suddenly get a very bad headache.  You have sudden weakness or numbness.  You suddenly lose the ability to speak, understand speech, or both. This information is not intended to replace advice given to you by your health care provider. Make sure you discuss any questions you have with your health care  provider. Document Released: 11/20/2003 Document Revised: 06/26/2015 Document Reviewed: 12/12/2013 Elsevier Interactive Patient Education  2018 [employer] Inc[employer]  

## 2016-08-02 NOTE — Progress Notes (Signed)
Subjective: Feels that his eye movements are improving, though wife is not sure.   Exam: Vitals:   08/01/16 2145 08/02/16 0100  BP: (!) 157/88 (!) 146/83  Pulse: 82 66  Resp: 20 20  Temp: 98.8 F (37.1 C) 98.2 F (36.8 C)   Gen: In bed, NAD Resp: non-labored breathing, no acute distress Abd: soft, nt  Neuro: MS: awake, alert, interactive and appropriate CN:VFF, left lateral rectus palsy.  Motor: 5/5 strength throughout Sensory:intact to LT Cerebellar: He has dysmetria in the left arm > left leg.   Pertinent Labs: CSF 4WBC, 5 RBC, Protein 36, Glucose 98 Lyme, ACE, VDRL, IgG index pending.   Serum ACE normal.   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 With subjective improvement with steriods, I would favor continuing steroids and repeating imaging. If findings are not improving, if patient clinically worsens, or if there remains significant concern, then leptomeningieal biopsy would likely be indicated.   Of note serum and CSF ACE are not highly sensitive markers for sarcoidosis.   Recommendations: 1) Finish pulse solumedrol today.  2) Start PO prednisone 80 mg daily 3) will need close neuro follow up and repeat imaging. I would favor holding other immune suppressants for now until he has repeat imaging and we can evaluate his response to steroids.   Ritta SlotMcNeill Juneau Doughman, MD [facility] [phone]  If 7pm- 7am, please page neurology on call as listed in AMION.

## 2016-08-04 LAB — CSF CULTURE

## 2016-08-04 LAB — CSF CULTURE W GRAM STAIN: Culture: NO GROWTH

## 2016-08-05 ENCOUNTER — Ambulatory Visit: Payer: Commercial Managed Care - HMO | Admitting: Podiatry

## 2016-08-05 ENCOUNTER — Ambulatory Visit: Payer: 59 | Admitting: Neurology

## 2016-08-05 ENCOUNTER — Ambulatory Visit (INDEPENDENT_AMBULATORY_CARE_PROVIDER_SITE_OTHER): Payer: 59 | Admitting: Neurology

## 2016-08-05 ENCOUNTER — Encounter: Payer: Self-pay | Admitting: Neurology

## 2016-08-05 VITALS — BP 142/86 | HR 93 | Resp 18 | Ht 72.0 in | Wt 277.0 lb

## 2016-08-05 DIAGNOSIS — D8689 Sarcoidosis of other sites: Secondary | ICD-10-CM | POA: Diagnosis not present

## 2016-08-05 DIAGNOSIS — Z79899 Other long term (current) drug therapy: Secondary | ICD-10-CM | POA: Diagnosis not present

## 2016-08-05 DIAGNOSIS — R937 Abnormal findings on diagnostic imaging of other parts of musculoskeletal system: Secondary | ICD-10-CM

## 2016-08-05 DIAGNOSIS — R2681 Unsteadiness on feet: Secondary | ICD-10-CM | POA: Diagnosis not present

## 2016-08-05 DIAGNOSIS — R930 Abnormal findings on diagnostic imaging of skull and head, not elsewhere classified: Secondary | ICD-10-CM

## 2016-08-05 DIAGNOSIS — H4922 Sixth [abducent] nerve palsy, left eye: Secondary | ICD-10-CM | POA: Diagnosis not present

## 2016-08-05 LAB — OLIGOCLONAL BANDS, CSF + SERM

## 2016-08-05 MED ORDER — AZATHIOPRINE 50 MG PO TABS: 50.0000 mg | ORAL_TABLET | Freq: Every day | ORAL | 5 refills | Status: DC
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
## 2016-08-05 NOTE — Patient Instructions (Signed)
I'm going to have you start azathioprine 50 mg tablets. We will work you up to 3 pills a day as follows: For 7 days, take one half pill twice a day For the next 7 days, take 1 pill twice a day Then, increase the dose to 1 pill in the morning and 2 at bedtime  If you feel flulike or achy you can slow this dose titration down

## 2016-08-06 NOTE — Progress Notes (Signed)
Please read Dr. Bonnita HollowSater's eval which is reasonable.

## 2016-08-07 LAB — HEPATITIS B SURFACE ANTIBODY,QUALITATIVE: HEP B SURFACE AB, QUAL: NONREACTIVE

## 2016-08-07 LAB — ANA W/REFLEX: Anti Nuclear Antibody(ANA): NEGATIVE

## 2016-08-07 LAB — HEPATITIS B CORE ANTIBODY, TOTAL: Hep B Core Total Ab: NEGATIVE

## 2016-08-07 LAB — SEDIMENTATION RATE: SED RATE: 2 mm/h (ref 0–15)

## 2016-08-07 LAB — HEPATITIS C ANTIBODY: Hep C Virus Ab: <0.1 s/co ratio (ref 0.0–0.9)

## 2016-08-07 LAB — HEPATITIS B SURFACE ANTIGEN: Hepatitis B Surface Ag: NEGATIVE

## 2016-08-08 LAB — QUANTIFERON IN TUBE
QFT TB AG MINUS NIL VALUE: 0.01 [IU]/mL
QUANTIFERON MITOGEN VALUE: 7.67 [IU]/mL
QUANTIFERON TB AG VALUE: 0.02 [IU]/mL
QUANTIFERON TB GOLD: NEGATIVE
Quantiferon Nil Value: 0.01 IU/mL

## 2016-08-08 LAB — QUANTIFERON TB GOLD ASSAY (BLOOD)

## 2016-08-09 ENCOUNTER — Encounter: Payer: Self-pay | Admitting: Internal Medicine

## 2016-08-09 ENCOUNTER — Ambulatory Visit (INDEPENDENT_AMBULATORY_CARE_PROVIDER_SITE_OTHER): Payer: 59 | Admitting: Internal Medicine

## 2016-08-09 VITALS — Temp 97.7°F | Wt 255.0 lb

## 2016-08-09 DIAGNOSIS — D72829 Elevated white blood cell count, unspecified: Secondary | ICD-10-CM

## 2016-08-09 DIAGNOSIS — I10 Essential (primary) hypertension: Secondary | ICD-10-CM | POA: Diagnosis not present

## 2016-08-09 DIAGNOSIS — R270 Ataxia, unspecified: Secondary | ICD-10-CM | POA: Diagnosis not present

## 2016-08-09 DIAGNOSIS — R635 Abnormal weight gain: Secondary | ICD-10-CM | POA: Diagnosis not present

## 2016-08-09 DIAGNOSIS — H4922 Sixth [abducent] nerve palsy, left eye: Secondary | ICD-10-CM

## 2016-08-09 DIAGNOSIS — L739 Follicular disorder, unspecified: Secondary | ICD-10-CM

## 2016-08-09 MED ORDER — CLOTRIMAZOLE-BETAMETHASONE 1-0.05 % EX CREA: 1.0000 "application " | TOPICAL_CREAM | Freq: Two times a day (BID) | CUTANEOUS | 2 refills | Status: DC

## 2016-08-09 NOTE — Patient Instructions (Signed)
Labs drawn and pending. Please check Accu-Cheks twice daily. Note given to be out of work indefinitely

## 2016-08-09 NOTE — Addendum Note (Signed)
Addended by: Margaree MackintoshBAXLEY, Kaden Daughdrill J on: 08/09/2016 11:32 AM   Modules accepted: Orders

## 2016-08-09 NOTE — Progress Notes (Addendum)
Subjective:    Patient ID: Adrian [name], male    DOB: [birth year], 46 y.o.   MRN: [id number]  HPI 46 year old Male recently hospitalized with ataxia that had fairly acute onset. Initially was diagnosed by Ophthalmologist, Dr. Manuella Ghazi, at [facility] with left VI nerve palsy and was given an eye patch.  Ophthalmologist asked that we check him for hypertension and diabetes. When I saw him on June 7 blood pressure was elevated at 142/100.His hemoglobin A1c was normal He weighed 254 pounds. On June 15 blood pressure was 158/92 and weight was 266 pounds. On that day we started him on amlodipine 5 mg daily. We got a call on June 27 that he was having some issues with dizziness which she thought was related to antihypertensive medication. We saw him on June 28 and he was found to be acutely ataxic. He was sent to the emergency department where he had an abnormal MRI of the brain with multiple contrast-enhancing lesions scattered throughout the brain the largest of which was located in the left aqueductal gray matter in the area of the oculomotor nerve nucleus likely accounting for the visual symptoms. The appearance was thought to represent infection or granulomatous disease such as sarcoidosis or demyelinating disease. Oligoclonal banding done on CSF  proved to be negative. CSF cell count showed 1 red blood cell, rare neutrophil, a few laps and rare monocyte. CSF culture negative. ACE level was 55 which was normal. HIV was nonreactive.  He's now on high-dose prednisone and is getting considerable amount of weight. Today he weighs in excess of 300 pounds. He has seen neurologist, Dr. Rachel Moulds who has follow-up appointment with him in August. Patient is not able to work due to this severe ataxia. 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  Patient says he had a lymph node biopsy at [facility] around 2003. Apparently had an abnormal chest x-ray at that time and  he says he was given a diagnosis of sarcoidosis. He says he actually saw an oncologist.  I have called Dr. Luana Shu, Pathologist at [facility] to see if they can locate pathology report. She says she will check. It may have been purged.    Review of Systems see above  His live in girlfriend noticed a rash on his back which is new     Objective:   Physical Exam Continues to have left VI nerve palsy. Continues to be sick mildly ataxic with tendency to drift to the right. Chest clear to auscultation. Cardiac exam regular rate and rhythm. 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 Accu-Cheks need to be done at home and results called to me. Continue amlodipine. For folliculitis we will try Lotrisone cream.  Addendum 08/11/2016: FMLA papers were presented for completion yesterday. These have been completed and faxed to [employer] are and city of [facility] fax #  [phone]. Currently he is written out of work for 3  months. Neurologist will need to help guide remainder of FMLA leave. MJB

## 2016-08-10 DIAGNOSIS — D8689 Sarcoidosis of other sites: Secondary | ICD-10-CM | POA: Diagnosis not present

## 2016-08-10 DIAGNOSIS — H4922 Sixth [abducent] nerve palsy, left eye: Secondary | ICD-10-CM | POA: Diagnosis not present

## 2016-08-10 LAB — CBC WITH DIFFERENTIAL/PLATELET
Basophils Absolute: 0 cells/uL (ref 0–200)
Basophils Relative: 0 %
EOS PCT: 1 %
Eosinophils Absolute: 156 cells/uL (ref 15–500)
HCT: 47 % (ref 38.5–50.0)
HEMOGLOBIN: 15.8 g/dL (ref 13.2–17.1)
LYMPHS ABS: 4836 {cells}/uL — AB (ref 850–3900)
Lymphocytes Relative: 31 %
MCH: 31.9 pg (ref 27.0–33.0)
MCHC: 33.6 g/dL (ref 32.0–36.0)
MCV: 94.9 fL (ref 80.0–100.0)
MPV: 9.8 fL (ref 7.5–12.5)
Monocytes Absolute: 1248 cells/uL — ABNORMAL HIGH (ref 200–950)
Monocytes Relative: 8 %
NEUTROS ABS: 9360 {cells}/uL — AB (ref 1500–7800)
Neutrophils Relative %: 60 %
PLATELETS: 298 10*3/uL (ref 140–400)
RBC: 4.95 MIL/uL (ref 4.20–5.80)
RDW: 13.2 % (ref 11.0–15.0)
WBC: 15.6 10*3/uL — AB (ref 3.8–10.8)

## 2016-08-10 LAB — TSH: TSH: 1.63 m[IU]/L (ref 0.40–4.50)

## 2016-08-10 LAB — BASIC METABOLIC PANEL
BUN: 25 mg/dL (ref 7–25)
CALCIUM: 9 mg/dL (ref 8.6–10.3)
CO2: 26 mmol/L (ref 20–31)
Chloride: 103 mmol/L (ref 98–110)
Creat: 1.21 mg/dL (ref 0.60–1.35)
Glucose, Bld: 89 mg/dL (ref 65–99)
Potassium: 3.9 mmol/L (ref 3.5–5.3)
SODIUM: 142 mmol/L (ref 135–146)

## 2016-08-10 LAB — PATHOLOGIST SMEAR REVIEW

## 2016-08-11 DIAGNOSIS — D869 Sarcoidosis, unspecified: Secondary | ICD-10-CM

## 2016-08-11 LAB — VDRL, CSF: SYPHILIS VDRL QUANT CSF: NONREACTIVE

## 2016-08-16 ENCOUNTER — Telehealth: Payer: Self-pay

## 2016-08-16 DIAGNOSIS — L739 Follicular disorder, unspecified: Secondary | ICD-10-CM

## 2016-08-16 MED ORDER — CLOTRIMAZOLE-BETAMETHASONE 1-0.05 % EX CREA: 1.0000 "application " | TOPICAL_CREAM | Freq: Two times a day (BID) | CUTANEOUS | 2 refills | Status: DC

## 2016-08-16 NOTE — Telephone Encounter (Signed)
Call in Lotrisone Cream to use bid with 3 refills. I saw it at last visit. Suspect it is yeast due to high dose steroids

## 2016-08-16 NOTE — Telephone Encounter (Signed)
Pt is aware.  

## 2016-08-16 NOTE — Telephone Encounter (Signed)
Sent in medication and LVM to call back

## 2016-08-16 NOTE — Telephone Encounter (Signed)
Called about a rash on his back that has now spread all over. Worried if this is from something with his condition or not and if they need to come in?

## 2016-08-18 ENCOUNTER — Telehealth: Payer: Self-pay | Admitting: Internal Medicine

## 2016-08-18 NOTE — Telephone Encounter (Signed)
Per Dr. Lenord FellersBaxley, patient continues to have gait instability.  [facility] and see if we can move up appointment from 8/20 to anything sooner.  Patient unavailable w/o 8/4-8/9.  Spoke with Misty StanleyLisa @ [facility] @ [phone]; Dr. Epimenio FootSater has an appointment for tomorrow, 7/19 at 3:00 p.m, arrive at 2:30 p.m.    Spoke with patient, he is still having the gail instability.  Dr. Lenord FellersBaxley unavailable by text.  Advised patient to take this appointment.  Called [facility] back and booked appointment for 7/19 at 3pm.  Patient will arrive at 2:30.

## 2016-08-19 ENCOUNTER — Encounter: Payer: Self-pay | Admitting: Neurology

## 2016-08-19 ENCOUNTER — Ambulatory Visit (INDEPENDENT_AMBULATORY_CARE_PROVIDER_SITE_OTHER): Payer: 59 | Admitting: Neurology

## 2016-08-19 VITALS — BP 154/86 | HR 84 | Resp 18 | Ht 72.0 in | Wt 268.5 lb

## 2016-08-19 DIAGNOSIS — R635 Abnormal weight gain: Secondary | ICD-10-CM | POA: Insufficient documentation

## 2016-08-19 DIAGNOSIS — D8689 Sarcoidosis of other sites: Secondary | ICD-10-CM | POA: Diagnosis not present

## 2016-08-19 DIAGNOSIS — R2681 Unsteadiness on feet: Secondary | ICD-10-CM | POA: Diagnosis not present

## 2016-08-19 DIAGNOSIS — H4922 Sixth [abducent] nerve palsy, left eye: Secondary | ICD-10-CM | POA: Diagnosis not present

## 2016-08-19 MED ORDER — PHENTERMINE HCL 37.5 MG PO CAPS: 37.5000 mg | ORAL_CAPSULE | ORAL | 5 refills | Status: DC

## 2016-08-19 NOTE — Patient Instructions (Signed)
Increase the azathioprine to 50 mg 3 tablets a day (the best ways to take 1 in the morning and 2 at night)  Reduce the prednisone to 3 pills a day.
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
## 2016-08-20 ENCOUNTER — Other Ambulatory Visit: Payer: Commercial Managed Care - HMO | Admitting: Internal Medicine

## 2016-08-23 ENCOUNTER — Encounter: Payer: Self-pay | Admitting: Internal Medicine

## 2016-08-23 ENCOUNTER — Ambulatory Visit (INDEPENDENT_AMBULATORY_CARE_PROVIDER_SITE_OTHER): Payer: 59 | Admitting: Internal Medicine

## 2016-08-23 VITALS — BP 152/90 | HR 105 | Temp 98.1°F | Ht 71.0 in | Wt 273.0 lb

## 2016-08-23 DIAGNOSIS — Z0000 Encounter for general adult medical examination without abnormal findings: Secondary | ICD-10-CM

## 2016-08-23 DIAGNOSIS — H4922 Sixth [abducent] nerve palsy, left eye: Secondary | ICD-10-CM | POA: Diagnosis not present

## 2016-08-23 DIAGNOSIS — D8689 Sarcoidosis of other sites: Secondary | ICD-10-CM | POA: Diagnosis not present

## 2016-08-23 DIAGNOSIS — I10 Essential (primary) hypertension: Secondary | ICD-10-CM

## 2016-08-23 DIAGNOSIS — R635 Abnormal weight gain: Secondary | ICD-10-CM | POA: Diagnosis not present

## 2016-08-23 DIAGNOSIS — R270 Ataxia, unspecified: Secondary | ICD-10-CM | POA: Diagnosis not present

## 2016-08-23 LAB — POCT URINALYSIS DIPSTICK
BILIRUBIN UA: NEGATIVE
KETONES UA: NEGATIVE
LEUKOCYTES UA: NEGATIVE
Nitrite, UA: NEGATIVE
PROTEIN UA: NEGATIVE
RBC UA: NEGATIVE
Spec Grav, UA: 1.025 (ref 1.010–1.025)
Urobilinogen, UA: 0.2 E.U./dL
pH, UA: 6 (ref 5.0–8.0)

## 2016-08-23 NOTE — Patient Instructions (Signed)
Continue medications prescribed by neurologist. Have repeat MRI later this week with further instructions to follow. Continue amlodipine.

## 2016-08-23 NOTE — Progress Notes (Signed)
Subjective:    Patient ID: Adrian [name], male    DOB: 07/01/1970, 46 y.o.   MRN: [id number]  HPI Patient is a 46 year old White Male who is here for previously scheduled physical examination but recently had issues with left sixth nerve palsy and ataxia. Was also recently found to be hypertensive and was started on Norvasc 5 mg daily.  He was hospitalized for evaluation of ataxia and persistent left 6 nerve palsy on June 28. MRI showed multiple lesions that were contrast-enhancing scattered throughout the brain. He had a lumbar puncture. There was 1 red blood cell count in the CSF fluid and 4 white blood cells including few lymphocytes and rare neutrophils. Culture was negative. Oligoclonal banding was 0. Serum ACE level was 55. IgG CSF index normal. CSF glucose was 98 and total protein 36. CSF VDRL nonreactive. Quadrant of fear on testing negative. ANA was negative. Sedimentation rate was 2. TSH was normal.  He was placed on high-dose steroids and gained a lot of weight. He was seen by neurologist, Dr. Epimenio FootSater Subsequently placed on Imuran with plans to repeat MRI in late July.  Patient says he hasn't seen a lot of progress in the last few weeks. He is still wearing a patch over left eye. Still having issues with balance. Girlfriend says he's fallen a few times. No injury with falls.  He gives a history of some type of biopsy perhaps a lymph node biopsy just above his sternal notch a number of years ago in [place]. Doesn't recall the name of the doctor. I attempted to find the pathology report by calling pathology department at [facility] and they could not locate the report. He doesn't remember what year the biopsy was done but remember seeing an oncologist in the [place] area. He will try to find the name of that physician for us. He says he was given a diagnosis of sarcoidosis at that time.  He has no known drug allergies.  He had fractured left fourth and fifth fingers  in 2014 due to a sports injury treated at [facility]. Left inguinal hernia repair 1998. In 19 8070 had a periorbital fracture treated by Dr. Adrian BlackwaterStinson.  Social history: He is divorced. Lives with girlfriend. Girlfriend is a Therapist, musichospice nurse. 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. Does not smoke. Consumes alcohol in the form of beer occasionally. He enjoys softball, canoeing, swimming and outdoor activities such as camping.  Family history: He has a son who will be attending [employer] this fall. Has a daughter in her early teens who is in good health. Father died at age 47 due to sepsis and AML complications. Mother living with history of ovarian cancer. One brother age 753 with obesity, diabetes mellitus and history of MI. One sister age 47 overweight.  Patient's weight is now 273 pounds. In August 2016, his weight was 222 pounds and on July 9 of this year, weight was 255 pounds. .   Review of Systems see above     Objective:   Physical Exam Skin warm and dry. Nodes none. Still has left sixth nerve palsy. Neck is supple without thyromegaly or JVD. No carotid bruits. Chest clear. Cardiac exam regular rate and rhythm normal S1 and S2. Prostate is normal without nodules. Abdomen no hepatosplenomegaly masses or tenderness. Extremities without pitting edema although patient and his girlfriend say he's had some edema of the lower extremities recently likely due to steroids.  Assessment & Plan:  Left sixth nerve palsy and ataxia with abnormal MRI of the brain? Neurosarcoidosis  Plan: He is to have repeat MRI later this week. Further recommendations to follow. Patient and his girlfriend express a desire for evaluation at [facility]. For now continue steroids and Imuran as prescribed by Dr. Epimenio Foot. Lab work from July 9 reviewed. Lipid panel has not been done recently. It would likely be abnormal due to steroid therapy. We can  obtain lipid panel fasting in a later date. Continue amlodipine

## 2016-08-24 ENCOUNTER — Encounter: Payer: Self-pay | Admitting: Neurology

## 2016-08-24 ENCOUNTER — Telehealth: Payer: Self-pay

## 2016-08-24 ENCOUNTER — Telehealth: Payer: Self-pay | Admitting: *Deleted

## 2016-08-24 MED ORDER — AZATHIOPRINE 50 MG PO TABS: ORAL_TABLET | ORAL | 1 refills | Status: DC

## 2016-08-24 NOTE — Telephone Encounter (Signed)
Called pt and to ask him to come by the office to sign medical releases for both doctors. LVM for him in regards to this

## 2016-08-24 NOTE — Telephone Encounter (Signed)
Pt called and wanted to let Dr. Lenord FellersBaxley know that he found out who treated him previously for the condition. It was Dr. Kemper DurieWilson Smith at [facility] and he referred him to an Oncologist Dr. Particia NearingHaviland. He said it was about 15 years ago but wanted to pass along the information incase it was needed

## 2016-08-24 NOTE — Telephone Encounter (Signed)
These doctors are with [facility]. Pt should come by and sign release and we will see if they have these records.

## 2016-08-24 NOTE — Telephone Encounter (Signed)
Imuran was increased at last ov.  [facility]/fim

## 2016-08-25 ENCOUNTER — Ambulatory Visit (INDEPENDENT_AMBULATORY_CARE_PROVIDER_SITE_OTHER): Payer: 59

## 2016-08-25 ENCOUNTER — Other Ambulatory Visit: Payer: 59

## 2016-08-25 DIAGNOSIS — D8689 Sarcoidosis of other sites: Secondary | ICD-10-CM | POA: Diagnosis not present

## 2016-08-25 DIAGNOSIS — H4922 Sixth [abducent] nerve palsy, left eye: Secondary | ICD-10-CM

## 2016-08-25 LAB — IGG CSF INDEX
ALBUMIN: 3.8 g/dL (ref 3.5–5.5)
Albumin CSF-mCnc: 22 mg/dL (ref 11–48)
CSF IgG Index: 0.6 (ref 0.0–0.7)
IGG/ALB RATIO, CSF: 0.12 (ref 0.00–0.25)
IgG (Immunoglobin G), Serum: 722 mg/dL (ref 700–1600)
IgG, CSF: 2.7 mg/dL (ref 0.0–8.6)

## 2016-08-25 LAB — ANGIOTENSIN CONVERTING ENZYME, CSF: ANGIO CONVERT ENZYME: 1.5 U/L (ref 0.0–2.8)

## 2016-08-25 LAB — B. BURGDORFI ANTIBODIES, CSF

## 2016-08-25 MED ORDER — GADOPENTETATE DIMEGLUMINE 469.01 MG/ML IV SOLN
20.0000 mL | Freq: Once | INTRAVENOUS | Status: AC | PRN
  Administered 2016-08-25: 20 mL via INTRAVENOUS

## 2016-08-26 ENCOUNTER — Ambulatory Visit (INDEPENDENT_AMBULATORY_CARE_PROVIDER_SITE_OTHER): Payer: 59

## 2016-08-26 ENCOUNTER — Ambulatory Visit (INDEPENDENT_AMBULATORY_CARE_PROVIDER_SITE_OTHER): Payer: 59 | Admitting: Podiatry

## 2016-08-26 ENCOUNTER — Encounter: Payer: Self-pay | Admitting: Podiatry

## 2016-08-26 VITALS — BP 168/116 | HR 111

## 2016-08-26 DIAGNOSIS — M779 Enthesopathy, unspecified: Secondary | ICD-10-CM

## 2016-08-26 DIAGNOSIS — M21629 Bunionette of unspecified foot: Secondary | ICD-10-CM

## 2016-08-26 DIAGNOSIS — L84 Corns and callosities: Secondary | ICD-10-CM

## 2016-08-26 MED ORDER — TRIAMCINOLONE ACETONIDE 10 MG/ML IJ SUSP
10.0000 mg | Freq: Once | INTRAMUSCULAR | Status: AC
  Administered 2016-08-26: 10 mg

## 2016-08-26 NOTE — Progress Notes (Signed)
   Subjective:    Patient ID: Adrian [name], male    DOB: 10/10/1970, 46 y.o.   MRN: [id number]  HPI  Chief Complaint  Patient presents with  . Callouses    Bottom of Lt foot, lateral ball of foot       Review of Systems  Constitutional: Positive for activity change, appetite change, diaphoresis and fatigue.  Respiratory: Positive for shortness of breath.   Gastrointestinal: Positive for abdominal distention.  Musculoskeletal: Positive for back pain and gait problem.  All other systems reviewed and are negative.      Objective:   Physical Exam        Assessment & Plan:

## 2016-08-26 NOTE — Progress Notes (Signed)
Subjective:    Patient ID: Adrian [name], male   DOB: 46 y.o.   MRN: [id number]   HPI patient presents with inflamed lesion on the plantar aspect of the right fifth metatarsal stating it's been bothering him and that he has been diagnosed with neuro sarcoidosis and it has affected his eyes    Review of Systems  All other systems reviewed and are negative.       Objective:  Physical Exam  Cardiovascular: Intact distal pulses.   Musculoskeletal: Normal range of motion.  Neurological: He is alert.  Skin: Skin is warm.  Nursing note and vitals reviewed.  neurovascular status found to be intact muscle strength adequate range of motion within normal limits with patient noted to have inflammation pain around the fifth MPJ right with fluid buildup and states that he does not have good balance secondary to flair up of his sarcoidosis in the cerebellum and that that is affecting his ability walk and that this is making it difficult for him to be ambulatory     Assessment:    Laboratory capsulitis fifth MPJ right with fluid buildup with part of the problem being do to other conditions     Plan:     H&P condition reviewed and careful injection of the right fifth MPJ was accomplished. I then debrided lesion and discussed possible orthotics and reappoint 3 weeks to see how he responds and see what else we may be able to do to help him with consideration for balance brace  X-rays were negative for signs of bone

## 2016-09-01 ENCOUNTER — Telehealth: Payer: Self-pay | Admitting: Neurology

## 2016-09-01 NOTE — Telephone Encounter (Signed)
The MRI looks better with less enhancement and edema and there are no new lesions noted.  I called to discuss and left a message. We should be able to reduce the prednisone further (reduce to 60 mg alternating with 20).

## 2016-09-01 NOTE — Telephone Encounter (Signed)
I spoke with Mr. Adrian [name] about the MRI looking better.   He reports that his appetite is not as ravenous on the phentermine as it was a couple weeks ago.  His balance is still mildly off. The double vision has not really improved much.  He is currently on azathioprine 150 mg daily and prednisone 60 mg daily. I will have him cut the prednisone down to 60 mg every other day alternating with 20 mg every other day.

## 2016-09-09 ENCOUNTER — Encounter: Payer: Self-pay | Admitting: Neurology

## 2016-09-10 ENCOUNTER — Other Ambulatory Visit: Payer: Self-pay | Admitting: Neurology

## 2016-09-10 ENCOUNTER — Telehealth: Payer: Self-pay | Admitting: Internal Medicine

## 2016-09-10 MED ORDER — PREDNISONE 20 MG PO TABS: ORAL_TABLET | ORAL | 2 refills | Status: DC

## 2016-09-10 NOTE — Telephone Encounter (Signed)
Adrian [name] called to advise that they have an appointment at [facility] with Dr. Gaynell FaceEckstein on Thursday, 09/30/16 @ 10:40 a.m.  Advised I would document this information in his chart.

## 2016-09-16 ENCOUNTER — Encounter: Payer: Self-pay | Admitting: Podiatry

## 2016-09-16 ENCOUNTER — Ambulatory Visit (INDEPENDENT_AMBULATORY_CARE_PROVIDER_SITE_OTHER): Payer: 59 | Admitting: Podiatry

## 2016-09-16 DIAGNOSIS — M779 Enthesopathy, unspecified: Secondary | ICD-10-CM

## 2016-09-16 DIAGNOSIS — M21629 Bunionette of unspecified foot: Secondary | ICD-10-CM | POA: Diagnosis not present

## 2016-09-16 NOTE — Progress Notes (Signed)
Subjective:    Patient ID: Adrian [name], male   DOB: 46 y.o.   MRN: [id number]   HPI patient states that he did real well for a period of time and is developed reoccurrence    ROS      Objective:  Physical Exam neurovascular status intact with patient noted to have inflammatory changes around the fifth MPJ right with fluid buildup around the joint surface and lesion formation     Assessment:  Inflammatory capsulitis fifth MPJ right with lesion formation       Plan:    I injected the capsule of the fifth MPJ plantar 3 mg Dexon some Kenalog 5 mill grams Xylocaine in the deep deep debridement of lesion which was tolerated well

## 2016-09-20 ENCOUNTER — Ambulatory Visit: Payer: 59 | Admitting: Neurology

## 2016-09-30 ENCOUNTER — Ambulatory Visit: Payer: 59 | Admitting: Neurology

## 2016-09-30 DIAGNOSIS — D869 Sarcoidosis, unspecified: Secondary | ICD-10-CM | POA: Diagnosis not present

## 2016-10-01 ENCOUNTER — Encounter: Payer: Self-pay | Admitting: Neurology

## 2016-10-14 ENCOUNTER — Encounter: Payer: Self-pay | Admitting: Internal Medicine

## 2016-10-14 ENCOUNTER — Ambulatory Visit (INDEPENDENT_AMBULATORY_CARE_PROVIDER_SITE_OTHER): Payer: 59 | Admitting: Internal Medicine

## 2016-10-14 VITALS — BP 160/98 | Wt 268.0 lb

## 2016-10-14 DIAGNOSIS — H4922 Sixth [abducent] nerve palsy, left eye: Secondary | ICD-10-CM

## 2016-10-14 DIAGNOSIS — Z79899 Other long term (current) drug therapy: Secondary | ICD-10-CM

## 2016-10-14 DIAGNOSIS — D8689 Sarcoidosis of other sites: Secondary | ICD-10-CM | POA: Diagnosis not present

## 2016-10-14 DIAGNOSIS — D84821 Immunodeficiency due to drugs: Secondary | ICD-10-CM

## 2016-10-14 DIAGNOSIS — R350 Frequency of micturition: Secondary | ICD-10-CM | POA: Diagnosis not present

## 2016-10-14 DIAGNOSIS — L03114 Cellulitis of left upper limb: Secondary | ICD-10-CM | POA: Diagnosis not present

## 2016-10-14 LAB — POCT URINALYSIS DIPSTICK
Bilirubin, UA: NEGATIVE
Blood, UA: NEGATIVE
Glucose, UA: 1000
KETONES UA: NEGATIVE
Leukocytes, UA: NEGATIVE
Nitrite, UA: NEGATIVE
PH UA: 7.5 (ref 5.0–8.0)
SPEC GRAV UA: 1.025 (ref 1.010–1.025)
UROBILINOGEN UA: 0.2 U/dL

## 2016-10-14 MED ORDER — CEFTRIAXONE SODIUM 1 G IJ SOLR
1.0000 g | Freq: Once | INTRAMUSCULAR | Status: AC
  Administered 2016-10-14: 1 g via INTRAMUSCULAR

## 2016-10-15 ENCOUNTER — Telehealth: Payer: Self-pay | Admitting: Internal Medicine

## 2016-10-15 LAB — CBC WITH DIFFERENTIAL/PLATELET
BASOS PCT: 0.1 %
Basophils Absolute: 15 cells/uL (ref 0–200)
EOS ABS: 0 {cells}/uL — AB (ref 15–500)
Eosinophils Relative: 0 %
HEMATOCRIT: 37.1 % — AB (ref 38.5–50.0)
HEMOGLOBIN: 13.2 g/dL (ref 13.2–17.1)
LYMPHS ABS: 740 {cells}/uL — AB (ref 850–3900)
MCH: 31.9 pg (ref 27.0–33.0)
MCHC: 35.6 g/dL (ref 32.0–36.0)
MCV: 89.6 fL (ref 80.0–100.0)
MPV: 10.9 fL (ref 7.5–12.5)
Monocytes Relative: 2.8 %
Neutro Abs: 13631 cells/uL — ABNORMAL HIGH (ref 1500–7800)
Neutrophils Relative %: 92.1 %
Platelets: 314 10*3/uL (ref 140–400)
RBC: 4.14 10*6/uL — AB (ref 4.20–5.80)
RDW: 12.6 % (ref 11.0–15.0)
Total Lymphocyte: 5 %
WBC: 14.8 10*3/uL — ABNORMAL HIGH (ref 3.8–10.8)
WBCMIX: 414 {cells}/uL (ref 200–950)

## 2016-10-15 LAB — HEMOGLOBIN A1C
Hgb A1c MFr Bld: 5.3 % of total Hgb (ref <5.7)
Mean Plasma Glucose: 105 (calc)
eAG (mmol/L): 5.8 (calc)

## 2016-10-15 LAB — TIQ-NTM

## 2016-10-15 LAB — URINE CULTURE
MICRO NUMBER:: 81012130
Result:: NO GROWTH
SPECIMEN QUALITY:: ADEQUATE

## 2016-10-15 MED ORDER — DOXYCYCLINE HYCLATE 100 MG PO TABS: 100.0000 mg | ORAL_TABLET | Freq: Two times a day (BID) | ORAL | 0 refills | Status: DC

## 2016-10-15 NOTE — Telephone Encounter (Signed)
Send in Doxycycline 100 mg twice a day x 10 days

## 2016-10-15 NOTE — Telephone Encounter (Signed)
Adrian [name] is calling; states that she thought you said that he was going to need to be on Doxycycline.  Is that correct?  Pharmacy:  [facility]  Thank you.

## 2016-10-15 NOTE — Telephone Encounter (Signed)
Med sent and LVM to let pt know

## 2016-10-17 NOTE — Progress Notes (Signed)
   Subjective:    Patient ID: Adrian [name], male    DOB: 09/29/1970, 46 y.o.   MRN: [id number]  HPI 46 year old Male recently diagnosed with neurosarcoidosis being treated with Imuran 150 mg total per day and prednisone 50 mg daily at [facility] in today with recent onset of left elbow redness. Fiance who is a nurse said redness extended up to posterior left upper arm. No fever or chills. No known injury.  Is also on amlodipine for hypertension.    Review of Systems complaint of urinary frequency therefore check hemoglobin A1c     Objective:   Physical Exam He has what I suspect is a portal of entry at left elbow with some type of abrasion that is superficial. There is no elbow effusion. Not tender to touch. Do not see erythema extending above elbow today       Assessment & Plan:  Cellulitis left elbow  Plan: Doxycycline 100 mg twice daily for 10 days. CBC and hemoglobin A1c drawn.1 g IM Rocephin  Follow-up on Monday, September 17 or sooner if worse.

## 2016-10-18 ENCOUNTER — Encounter: Payer: Self-pay | Admitting: Internal Medicine

## 2016-10-18 ENCOUNTER — Ambulatory Visit (INDEPENDENT_AMBULATORY_CARE_PROVIDER_SITE_OTHER): Payer: 59 | Admitting: Internal Medicine

## 2016-10-18 VITALS — BP 158/100 | HR 112 | Temp 97.5°F | Wt 268.0 lb

## 2016-10-18 DIAGNOSIS — L03114 Cellulitis of left upper limb: Secondary | ICD-10-CM | POA: Diagnosis not present

## 2016-10-18 DIAGNOSIS — I10 Essential (primary) hypertension: Secondary | ICD-10-CM

## 2016-10-18 MED ORDER — FUROSEMIDE 40 MG PO TABS: 40.0000 mg | ORAL_TABLET | Freq: Every day | ORAL | 3 refills | Status: DC

## 2016-10-18 MED ORDER — CEFTRIAXONE SODIUM 1 G IJ SOLR
1.0000 g | Freq: Once | INTRAMUSCULAR | Status: AC
  Administered 2016-10-14: 1 g via INTRAMUSCULAR

## 2016-10-18 MED ORDER — AMLODIPINE BESYLATE 5 MG PO TABS: 5.0000 mg | ORAL_TABLET | Freq: Every day | ORAL | 3 refills | Status: DC

## 2016-10-18 NOTE — Patient Instructions (Signed)
Finish course of doxycycline for cellulitis of left elbow. Start Lasix 40 mg daily for dependent edema and elevated blood pressure. Call with blood pressure readings in a week.

## 2016-10-18 NOTE — Progress Notes (Signed)
   Subjective:    Patient ID: Adrian [name], male    DOB: [birth year], 46 y.o.   MRN: [id number]  HPI He returns today to follow-up on cellulitis left elbow. At last visit he was given 1 g IM Rocephin and started on doxycycline 100 mg twice daily for 10 days.  Left elbow cellulitis has improved. He has a couple of new abrasions dorsal aspect of left hand.  He continues to have issues with dependent edema and his blood pressure is elevated at 158/100 despite being only amlodipine 5 mg daily.    Review of Systems     Objective:   Physical Exam Left elbow has no significant redness. No effusion.  2 abrasions dorsal aspect left hand without evidence of infection.       Assessment & Plan:  Improvement cellulitis left elbow  Abrasions left hand  Elevated blood pressure  Dependent edema  Plan: He's, start on Lasix 40 mg daily and his fiance who is a nurse will call with blood pressure readings in a week. We may need to increase it to 40 mg twice daily.  Dr. Sherol Dade put him on phentermine off think to combat weight gain with prednisone. He is currently well prednisone 50 mg daily.

## 2016-10-18 NOTE — Progress Notes (Signed)
   Subjective:    Patient ID: Adrian [name], male    DOB: May 26, 1970, 46 y.o.   MRN: [id number]  HPI 46  year old  Male     Review of Systems     Objective:   Physical Exam        Assessment & Plan:

## 2016-10-25 ENCOUNTER — Telehealth: Payer: Self-pay

## 2016-10-25 MED ORDER — CYCLOBENZAPRINE HCL 10 MG PO TABS: 10.0000 mg | ORAL_TABLET | Freq: Three times a day (TID) | ORAL | 0 refills | Status: DC | PRN

## 2016-10-25 NOTE — Telephone Encounter (Signed)
Adrian [name] is aware.

## 2016-10-25 NOTE — Telephone Encounter (Signed)
Pt bent to pick something up and felt a pop between his shoulder blades. States he is in pain and it is hard to take a deep breath, please advise on what pt should do

## 2016-10-25 NOTE — Telephone Encounter (Signed)
Ibuprofen will probably not help. The steroids should help with inflammation and are stronger than Ibuprofen. He can take pain meds. Likely not a PE if he heard a pop. He can have Flexeril 10 mg tid #30 and apply ice to it.

## 2016-10-25 NOTE — Telephone Encounter (Signed)
Adrian [name] states that she has tried to get him to go the hospital. He declines, she stated he was taking ibuprofen and using icy hot. She said he will not take pain medication so she mentioned a muscle relaxer maybe. She said he is better today then last night because last night she was afraid he had a possible PE but she does not think that today. Please advise, she said that she will keep an eye on him but he is not wanting to come in or go to the hospital.

## 2016-10-27 ENCOUNTER — Other Ambulatory Visit (HOSPITAL_COMMUNITY): Payer: Self-pay

## 2016-10-27 ENCOUNTER — Other Ambulatory Visit: Payer: Self-pay

## 2016-10-27 ENCOUNTER — Encounter (HOSPITAL_COMMUNITY): Payer: Self-pay | Admitting: Emergency Medicine

## 2016-10-27 ENCOUNTER — Inpatient Hospital Stay (HOSPITAL_COMMUNITY): Payer: 59

## 2016-10-27 ENCOUNTER — Inpatient Hospital Stay (HOSPITAL_COMMUNITY)
Admission: EM | Admit: 2016-10-27 | Discharge: 2016-10-29 | DRG: 872 | Disposition: A | Discharge status: Home / Self Care | Payer: 59 | Attending: Internal Medicine | Admitting: Internal Medicine

## 2016-10-27 ENCOUNTER — Emergency Department (HOSPITAL_COMMUNITY): Payer: 59

## 2016-10-27 ENCOUNTER — Other Ambulatory Visit: Payer: Self-pay | Admitting: Internal Medicine

## 2016-10-27 ENCOUNTER — Telehealth: Payer: Self-pay

## 2016-10-27 DIAGNOSIS — X58XXXA Exposure to other specified factors, initial encounter: Secondary | ICD-10-CM

## 2016-10-27 DIAGNOSIS — T380X5A Adverse effect of glucocorticoids and synthetic analogues, initial encounter: Secondary | ICD-10-CM | POA: Diagnosis present

## 2016-10-27 DIAGNOSIS — Z7952 Long term (current) use of systemic steroids: Secondary | ICD-10-CM | POA: Diagnosis not present

## 2016-10-27 DIAGNOSIS — A419 Sepsis, unspecified organism: Secondary | ICD-10-CM | POA: Diagnosis not present

## 2016-10-27 DIAGNOSIS — D8689 Sarcoidosis of other sites: Secondary | ICD-10-CM | POA: Diagnosis present

## 2016-10-27 DIAGNOSIS — R740 Nonspecific elevation of levels of transaminase and lactic acid dehydrogenase [LDH]: Secondary | ICD-10-CM | POA: Diagnosis present

## 2016-10-27 DIAGNOSIS — R079 Chest pain, unspecified: Secondary | ICD-10-CM | POA: Diagnosis not present

## 2016-10-27 DIAGNOSIS — E099 Drug or chemical induced diabetes mellitus without complications: Secondary | ICD-10-CM | POA: Diagnosis present

## 2016-10-27 DIAGNOSIS — R739 Hyperglycemia, unspecified: Secondary | ICD-10-CM | POA: Diagnosis not present

## 2016-10-27 DIAGNOSIS — H4920 Sixth [abducent] nerve palsy, unspecified eye: Secondary | ICD-10-CM | POA: Diagnosis present

## 2016-10-27 DIAGNOSIS — L03116 Cellulitis of left lower limb: Secondary | ICD-10-CM | POA: Diagnosis not present

## 2016-10-27 DIAGNOSIS — Z8249 Family history of ischemic heart disease and other diseases of the circulatory system: Secondary | ICD-10-CM | POA: Diagnosis not present

## 2016-10-27 DIAGNOSIS — I10 Essential (primary) hypertension: Secondary | ICD-10-CM | POA: Diagnosis not present

## 2016-10-27 DIAGNOSIS — F17220 Nicotine dependence, chewing tobacco, uncomplicated: Secondary | ICD-10-CM | POA: Diagnosis present

## 2016-10-27 DIAGNOSIS — Z6836 Body mass index (BMI) 36.0-36.9, adult: Secondary | ICD-10-CM | POA: Diagnosis not present

## 2016-10-27 DIAGNOSIS — L0390 Cellulitis, unspecified: Secondary | ICD-10-CM | POA: Diagnosis present

## 2016-10-27 DIAGNOSIS — Z833 Family history of diabetes mellitus: Secondary | ICD-10-CM

## 2016-10-27 DIAGNOSIS — E0965 Drug or chemical induced diabetes mellitus with hyperglycemia: Secondary | ICD-10-CM | POA: Diagnosis present

## 2016-10-27 DIAGNOSIS — Z79899 Other long term (current) drug therapy: Secondary | ICD-10-CM

## 2016-10-27 DIAGNOSIS — Z9889 Other specified postprocedural states: Secondary | ICD-10-CM

## 2016-10-27 DIAGNOSIS — R000 Tachycardia, unspecified: Secondary | ICD-10-CM | POA: Diagnosis present

## 2016-10-27 DIAGNOSIS — M7989 Other specified soft tissue disorders: Secondary | ICD-10-CM | POA: Diagnosis not present

## 2016-10-27 DIAGNOSIS — S2241XA Multiple fractures of ribs, right side, initial encounter for closed fracture: Secondary | ICD-10-CM | POA: Diagnosis present

## 2016-10-27 DIAGNOSIS — S2231XA Fracture of one rib, right side, initial encounter for closed fracture: Secondary | ICD-10-CM | POA: Diagnosis present

## 2016-10-27 DIAGNOSIS — Z809 Family history of malignant neoplasm, unspecified: Secondary | ICD-10-CM

## 2016-10-27 DIAGNOSIS — R600 Localized edema: Secondary | ICD-10-CM | POA: Diagnosis not present

## 2016-10-27 LAB — COMPREHENSIVE METABOLIC PANEL
ALK PHOS: 56 U/L (ref 38–126)
ALT: 28 U/L (ref 17–63)
AST: 27 U/L (ref 15–41)
Albumin: 3.6 g/dL (ref 3.5–5.0)
Anion gap: 9 (ref 5–15)
BUN: 19 mg/dL (ref 6–20)
CHLORIDE: 100 mmol/L — AB (ref 101–111)
CO2: 27 mmol/L (ref 22–32)
CREATININE: 1.44 mg/dL — AB (ref 0.61–1.24)
Calcium: 9.1 mg/dL (ref 8.9–10.3)
GFR calc Af Amer: >60 mL/min (ref >60)
GFR, EST NON AFRICAN AMERICAN: 57 mL/min — AB (ref >60)
Glucose, Bld: 252 mg/dL — ABNORMAL HIGH (ref 65–99)
Potassium: 4.3 mmol/L (ref 3.5–5.1)
Sodium: 136 mmol/L (ref 135–145)
Total Bilirubin: 1.3 mg/dL — ABNORMAL HIGH (ref 0.3–1.2)
Total Protein: 6.2 g/dL — ABNORMAL LOW (ref 6.5–8.1)

## 2016-10-27 LAB — I-STAT CG4 LACTIC ACID, ED
Lactic Acid, Venous: 2.9 mmol/L (ref 0.5–1.9)
Lactic Acid, Venous: 1.62 mmol/L (ref 0.5–1.9)

## 2016-10-27 LAB — CBC WITH DIFFERENTIAL/PLATELET
Basophils Absolute: 0 10*3/uL (ref 0.0–0.1)
Basophils Relative: 0 %
EOS ABS: 0 10*3/uL (ref 0.0–0.7)
EOS PCT: 0 %
HCT: 36.8 % — ABNORMAL LOW (ref 39.0–52.0)
Hemoglobin: 12.2 g/dL — ABNORMAL LOW (ref 13.0–17.0)
LYMPHS ABS: 0.9 10*3/uL (ref 0.7–4.0)
Lymphocytes Relative: 5 %
MCH: 31.5 pg (ref 26.0–34.0)
MCHC: 33.2 g/dL (ref 30.0–36.0)
MCV: 95.1 fL (ref 78.0–100.0)
MONOS PCT: 2 %
Monocytes Absolute: 0.4 10*3/uL (ref 0.1–1.0)
Neutro Abs: 18.4 10*3/uL — ABNORMAL HIGH (ref 1.7–7.7)
Neutrophils Relative %: 93 %
PLATELETS: 267 10*3/uL (ref 150–400)
RBC: 3.87 MIL/uL — ABNORMAL LOW (ref 4.22–5.81)
RDW: 14 % (ref 11.5–15.5)
WBC: 19.8 10*3/uL — AB (ref 4.0–10.5)

## 2016-10-27 LAB — URINALYSIS, ROUTINE W REFLEX MICROSCOPIC
BACTERIA UA: NONE SEEN
Bilirubin Urine: NEGATIVE
Glucose, UA: >=500 mg/dL — AB
HGB URINE DIPSTICK: NEGATIVE
KETONES UR: NEGATIVE mg/dL
Leukocytes, UA: NEGATIVE
NITRITE: NEGATIVE
PROTEIN: NEGATIVE mg/dL
RBC / HPF: NONE SEEN RBC/hpf (ref 0–5)
Specific Gravity, Urine: 1.026 (ref 1.005–1.030)
Squamous Epithelial / LPF: NONE SEEN
pH: 6 (ref 5.0–8.0)

## 2016-10-27 LAB — I-STAT TROPONIN, ED: TROPONIN I, POC: 0.01 ng/mL (ref 0.00–0.08)

## 2016-10-27 LAB — GLUCOSE, CAPILLARY: Glucose-Capillary: 149 mg/dL — ABNORMAL HIGH (ref 65–99)

## 2016-10-27 MED ORDER — POLYETHYLENE GLYCOL 3350 17 G PO PACK: 17.0000 g | PACK | Freq: Every day | ORAL | Status: DC | PRN

## 2016-10-27 MED ORDER — VANCOMYCIN HCL IN DEXTROSE 1-5 GM/200ML-% IV SOLN
1000.0000 mg | Freq: Two times a day (BID) | INTRAVENOUS | Status: DC
  Administered 2016-10-28 – 2016-10-29 (×3): 1000 mg via INTRAVENOUS
  Filled 2016-10-27 (×4): qty 200

## 2016-10-27 MED ORDER — AZATHIOPRINE 50 MG PO TABS
50.0000 mg | ORAL_TABLET | Freq: Every day | ORAL | Status: DC
  Administered 2016-10-28 – 2016-10-29 (×2): 50 mg via ORAL
  Filled 2016-10-27 (×2): qty 1

## 2016-10-27 MED ORDER — AMLODIPINE BESYLATE 5 MG PO TABS
5.0000 mg | ORAL_TABLET | Freq: Every day | ORAL | Status: DC
  Administered 2016-10-28 – 2016-10-29 (×2): 5 mg via ORAL
  Filled 2016-10-27 (×2): qty 1

## 2016-10-27 MED ORDER — CLINDAMYCIN PHOSPHATE 600 MG/50ML IV SOLN
600.0000 mg | Freq: Once | INTRAVENOUS | Status: AC
  Administered 2016-10-27: 600 mg via INTRAVENOUS
  Filled 2016-10-27: qty 50

## 2016-10-27 MED ORDER — ACETAMINOPHEN 325 MG PO TABS: 650.0000 mg | ORAL_TABLET | Freq: Four times a day (QID) | ORAL | Status: DC | PRN

## 2016-10-27 MED ORDER — FAMOTIDINE 10 MG PO TABS
10.0000 mg | ORAL_TABLET | Freq: Two times a day (BID) | ORAL | Status: DC
  Administered 2016-10-27 – 2016-10-29 (×4): 10 mg via ORAL
  Filled 2016-10-27 (×4): qty 1

## 2016-10-27 MED ORDER — PIPERACILLIN-TAZOBACTAM 3.375 G IVPB 30 MIN
3.3750 g | Freq: Once | INTRAVENOUS | Status: AC
  Administered 2016-10-27: 3.375 g via INTRAVENOUS
  Filled 2016-10-27: qty 50

## 2016-10-27 MED ORDER — INSULIN ASPART 100 UNIT/ML ~~LOC~~ SOLN: 0.0000 [IU] | Freq: Every day | SUBCUTANEOUS | Status: DC

## 2016-10-27 MED ORDER — PANTOPRAZOLE SODIUM 40 MG PO TBEC
40.0000 mg | DELAYED_RELEASE_TABLET | Freq: Every day | ORAL | Status: DC
  Administered 2016-10-28 – 2016-10-29 (×2): 40 mg via ORAL
  Filled 2016-10-27 (×2): qty 1

## 2016-10-27 MED ORDER — SODIUM CHLORIDE 0.9% FLUSH
3.0000 mL | Freq: Two times a day (BID) | INTRAVENOUS | Status: DC
  Administered 2016-10-27 – 2016-10-28 (×2): 3 mL via INTRAVENOUS

## 2016-10-27 MED ORDER — LACTATED RINGERS IV BOLUS (SEPSIS)
2000.0000 mL | Freq: Once | INTRAVENOUS | Status: AC
  Administered 2016-10-27: 2000 mL via INTRAVENOUS

## 2016-10-27 MED ORDER — SODIUM CHLORIDE 0.9% FLUSH: 3.0000 mL | INTRAVENOUS | Status: DC | PRN

## 2016-10-27 MED ORDER — ONDANSETRON HCL 4 MG/2ML IJ SOLN: 4.0000 mg | Freq: Four times a day (QID) | INTRAMUSCULAR | Status: DC | PRN

## 2016-10-27 MED ORDER — SODIUM CHLORIDE 0.9 % IV SOLN: 250.0000 mL | INTRAVENOUS | Status: DC | PRN

## 2016-10-27 MED ORDER — INSULIN ASPART 100 UNIT/ML ~~LOC~~ SOLN
0.0000 [IU] | Freq: Three times a day (TID) | SUBCUTANEOUS | Status: DC
  Administered 2016-10-28: 2 [IU] via SUBCUTANEOUS

## 2016-10-27 MED ORDER — ACETAMINOPHEN 650 MG RE SUPP: 650.0000 mg | Freq: Four times a day (QID) | RECTAL | Status: DC | PRN

## 2016-10-27 MED ORDER — AZATHIOPRINE 50 MG PO TABS
100.0000 mg | ORAL_TABLET | Freq: Every day | ORAL | Status: DC
  Administered 2016-10-27 – 2016-10-28 (×2): 100 mg via ORAL
  Filled 2016-10-27 (×2): qty 2

## 2016-10-27 MED ORDER — SODIUM CHLORIDE 0.9 % IV SOLN
2000.0000 mg | Freq: Once | INTRAVENOUS | Status: AC
  Administered 2016-10-27: 2000 mg via INTRAVENOUS
  Filled 2016-10-27: qty 2000

## 2016-10-27 MED ORDER — PIPERACILLIN-TAZOBACTAM 3.375 G IVPB
3.3750 g | Freq: Three times a day (TID) | INTRAVENOUS | Status: DC
  Administered 2016-10-28 – 2016-10-29 (×4): 3.375 g via INTRAVENOUS
  Filled 2016-10-27 (×7): qty 50

## 2016-10-27 MED ORDER — ONDANSETRON HCL 4 MG PO TABS
4.0000 mg | ORAL_TABLET | Freq: Four times a day (QID) | ORAL | Status: DC | PRN
Start: 2016-10-27 — End: 2016-10-29

## 2016-10-27 MED ORDER — CYCLOBENZAPRINE HCL 10 MG PO TABS
10.0000 mg | ORAL_TABLET | Freq: Three times a day (TID) | ORAL | Status: DC | PRN
  Administered 2016-10-27: 10 mg via ORAL
  Filled 2016-10-27: qty 1

## 2016-10-27 MED ORDER — HYDROCODONE-ACETAMINOPHEN 5-325 MG PO TABS
1.0000 | ORAL_TABLET | ORAL | Status: DC | PRN
  Administered 2016-10-28 – 2016-10-29 (×3): 2 via ORAL
  Filled 2016-10-27 (×3): qty 2

## 2016-10-27 MED ORDER — SENNA 8.6 MG PO TABS
1.0000 | ORAL_TABLET | Freq: Two times a day (BID) | ORAL | Status: DC
  Administered 2016-10-28 – 2016-10-29 (×3): 8.6 mg via ORAL
  Filled 2016-10-27 (×4): qty 1

## 2016-10-27 MED ORDER — PREDNISONE 50 MG PO TABS
50.0000 mg | ORAL_TABLET | Freq: Every day | ORAL | Status: DC
  Administered 2016-10-28 – 2016-10-29 (×2): 50 mg via ORAL
  Filled 2016-10-27 (×2): qty 1

## 2016-10-27 NOTE — Telephone Encounter (Signed)
Pts GF called and stated that his left foot and ankle is swollen and red and hot to the touch. Dr. [name] said he needs to go to the ER. Pt is aware

## 2016-10-27 NOTE — Progress Notes (Signed)
Pharmacy Antibiotic Note  Adrian [name] is a 46 y.o. male admitted on 10/27/2016 with cellulitis.  Pharmacy has been consulted for Vancomycin/Zosyn dosing. 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 WBC is elevated. Noted bump in Scr.   Plan: Vancomycin 2000 mg IV x 1, then give 1000 mg IV q12h Zosyn 3.375G IV q8h to be infused over 4 hours Trend WBC, temp, renal function  F/U infectious work-up Drug levels as indicated   Height: 6' (182.9 cm) Weight: 267 lb (121.1 kg) IBW/kg (Calculated) : 77.6  Temp (24hrs), Avg:98 F (36.7 C), Min:97.7 F (36.5 C), Max:98.2 F (36.8 C)   Recent Labs Lab 10/27/16 1620 10/27/16 1635 10/27/16 2057  WBC 19.8*  --   --   CREATININE 1.44*  --   --   LATICACIDVEN  --  2.90* 1.62    Estimated Creatinine Clearance: 85.2 mL/min (A) (by C-G formula based on SCr of 1.44 mg/dL (H)).    No Known Allergies   Adrian [name] 10/27/2016 11:14 PM

## 2016-10-27 NOTE — ED Notes (Signed)
The pt has no complaints he ate his dinner tray  La tated ringers still infusing
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
## 2016-10-27 NOTE — ED Notes (Signed)
Pt to xray
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
## 2016-10-27 NOTE — ED Triage Notes (Signed)
Pt reports intermittent R sided CP, worse with movement, x 3 days. Pt also reports new cellulitis to L ankle starting today, reports finishing doxycycline for cellulitis to L elbow on Sunday. Pt reports hx neural sarcoidosis.  Resp e/u at this time, NAD noted.L ankle swollen, red.

## 2016-10-27 NOTE — ED Notes (Signed)
Lactic = 2.9. Dr. Corlis Leak notified. Patient will be moved up in line.

## 2016-10-27 NOTE — ED Notes (Signed)
Repost called to rn on 6n

## 2016-10-27 NOTE — ED Notes (Signed)
Informed first nurse Rhett Bannister. of lactic 2.90

## 2016-10-28 ENCOUNTER — Inpatient Hospital Stay (HOSPITAL_COMMUNITY): Payer: 59

## 2016-10-28 ENCOUNTER — Encounter (HOSPITAL_COMMUNITY): Payer: Self-pay

## 2016-10-28 DIAGNOSIS — L03116 Cellulitis of left lower limb: Secondary | ICD-10-CM

## 2016-10-28 DIAGNOSIS — M7989 Other specified soft tissue disorders: Secondary | ICD-10-CM

## 2016-10-28 DIAGNOSIS — R739 Hyperglycemia, unspecified: Secondary | ICD-10-CM

## 2016-10-28 DIAGNOSIS — I10 Essential (primary) hypertension: Secondary | ICD-10-CM

## 2016-10-28 DIAGNOSIS — Z7952 Long term (current) use of systemic steroids: Secondary | ICD-10-CM

## 2016-10-28 DIAGNOSIS — D8689 Sarcoidosis of other sites: Secondary | ICD-10-CM

## 2016-10-28 LAB — CBC
HCT: 37.7 % — ABNORMAL LOW (ref 39.0–52.0)
Hemoglobin: 12 g/dL — ABNORMAL LOW (ref 13.0–17.0)
MCH: 30.7 pg (ref 26.0–34.0)
MCHC: 31.8 g/dL (ref 30.0–36.0)
MCV: 96.4 fL (ref 78.0–100.0)
PLATELETS: 264 10*3/uL (ref 150–400)
RBC: 3.91 MIL/uL — ABNORMAL LOW (ref 4.22–5.81)
RDW: 13.9 % (ref 11.5–15.5)
WBC: 13.4 10*3/uL — ABNORMAL HIGH (ref 4.0–10.5)

## 2016-10-28 LAB — COMPREHENSIVE METABOLIC PANEL
ALK PHOS: 46 U/L (ref 38–126)
ALT: 25 U/L (ref 17–63)
AST: 16 U/L (ref 15–41)
Albumin: 3.3 g/dL — ABNORMAL LOW (ref 3.5–5.0)
Anion gap: 7 (ref 5–15)
BUN: 16 mg/dL (ref 6–20)
CALCIUM: 8.7 mg/dL — AB (ref 8.9–10.3)
CHLORIDE: 104 mmol/L (ref 101–111)
CO2: 29 mmol/L (ref 22–32)
CREATININE: 1.1 mg/dL (ref 0.61–1.24)
GFR calc Af Amer: >60 mL/min (ref >60)
GFR calc non Af Amer: >60 mL/min (ref >60)
GLUCOSE: 107 mg/dL — AB (ref 65–99)
Potassium: 4 mmol/L (ref 3.5–5.1)
SODIUM: 140 mmol/L (ref 135–145)
Total Bilirubin: 1.1 mg/dL (ref 0.3–1.2)
Total Protein: 6.2 g/dL — ABNORMAL LOW (ref 6.5–8.1)

## 2016-10-28 LAB — GLUCOSE, CAPILLARY
GLUCOSE-CAPILLARY: 189 mg/dL — AB (ref 65–99)
Glucose-Capillary: 91 mg/dL (ref 65–99)
Glucose-Capillary: 122 mg/dL — ABNORMAL HIGH (ref 65–99)
Glucose-Capillary: 157 mg/dL — ABNORMAL HIGH (ref 65–99)

## 2016-10-28 LAB — HEMOGLOBIN A1C
Hgb A1c MFr Bld: 4.9 % (ref 4.8–5.6)
Mean Plasma Glucose: 93.93 mg/dL

## 2016-10-28 LAB — APTT: APTT: 28 s (ref 24–36)

## 2016-10-28 LAB — PHOSPHORUS: Phosphorus: 3.9 mg/dL (ref 2.5–4.6)

## 2016-10-28 LAB — LACTIC ACID, PLASMA
Lactic Acid, Venous: 1.6 mmol/L (ref 0.5–1.9)
Lactic Acid, Venous: 1.8 mmol/L (ref 0.5–1.9)

## 2016-10-28 LAB — MRSA PCR SCREENING: MRSA BY PCR: NEGATIVE

## 2016-10-28 LAB — PROCALCITONIN: Procalcitonin: 0.14 ng/mL

## 2016-10-28 LAB — PROTIME-INR
INR: 1.05
Prothrombin Time: 13.6 seconds (ref 11.4–15.2)

## 2016-10-28 LAB — TSH: TSH: 0.754 u[IU]/mL (ref 0.350–4.500)

## 2016-10-28 LAB — MAGNESIUM: MAGNESIUM: 2.3 mg/dL (ref 1.7–2.4)

## 2016-10-28 NOTE — Progress Notes (Signed)
Patient ID: Adrian [name], male   DOB: [birth year], 46 y.o.   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 He was admitted with left ankle cellulitis and started on IV antibiotics.   Assessment & Plan:   Active Problems:   Essential hypertension   Neurosarcoidosis   Cellulitis of left ankle   Current chronic use of systemic steroids   Sepsis (HCC)   Hyperglycemia   Steroid-induced diabetes (HCC)   Right rib fracture   Cellulitis   Left ankle cellulitis - Patient is immunosuppressed. Continue IV broad-spectrum antibiotics for now. Follow cultures  Sepsis - Probably secondary to cellulitis. - Hemodynamically improving - Follow cultures. Continue antibiotics  Hypertension - Blood pressure stable. Continue Norvasc. Keep holding Lasix for now  Leukocytosis - Improving. Repeat a.m. Labs  Neurosarcoidosis - Continue home steroid and azathioprine. Outpatient follow-up  Steroid-induced hyperglycemia - Monitor blood sugars  Right hip fracture - Questionable cause. Continue pain medications. Outpatient follow-up  Morbid obesity - Outpatient follow-up   DVT prophylaxis: Lovenox Code Status: Full Family Communication:  Discussed with girlfriend at bedside Disposition Plan:Home in 1-3 days  Consultants: None  Procedures: None  Antimicrobials: Vancomycin and Zosyn from 10/27/2016    Subjective: Patient seen and examined at bedside. He feels better. Overnight fever, nausea or vomiting. His left ankle pain is improving  Objective: Vitals:   10/27/16 2215 10/27/16 2230 10/27/16 2254 10/28/16 0455  BP: 126/78 134/88 (!) 146/74 (!) 148/83  Pulse: 86 84 72 90  Resp: 20 (!) Temp:   97.7 F (36.5 C) 97.9 F (36.6 C)  TempSrc:   Oral Oral  SpO2: 94% 95% 99% 99%  Weight:   121.1 kg (267 lb)   Height:   6' (1.829 m)     Intake/Output Summary (Last 24 hours) at 10/28/16 1349 Last data filed at 10/28/16 0600  Gross per 24 hour  Intake              460 ml  Output                0 ml  Net              460 ml   Filed Weights   10/27/16 2254  Weight: 121.1 kg (267 lb)    Examination:  General exam: Appears calm and comfortable  Respiratory system: Bilateral decreased breath sound at bases Cardiovascular system: S1 & S2 heard, Rate controlled.  Gastrointestinal system: Abdomen is morbidly obese, nondistended, soft and nontender. Normal bowel sounds heard. Extremities: No cyanosis, clubbing; left ankle edema with erythema and mild tenderness   Data Reviewed: I have personally reviewed following labs and imaging studies  CBC:  Recent Labs Lab 10/27/16 1620 10/28/16 0342  WBC 19.8* 13.4*  NEUTROABS 18.4*  --   HGB 12.2* 12.0*  HCT 36.8* 37.7*  MCV 95.1 96.4  PLT 267 264   Basic Metabolic Panel:  Recent Labs Lab 10/27/16 1620 10/28/16 0342  NA 136 140  K 4.3 4.0  CL 100* 104  CO2 27 29  GLUCOSE 252* 107*  BUN 19 16  CREATININE 1.44* 1.10  CALCIUM 9.1 8.7*  MG  --  2.3  PHOS  --  3.9   GFR: Estimated Creatinine Clearance: 111.6 mL/min (by C-G formula based on SCr of 1.1 mg/dL). Liver Function Tests:  Recent Labs Lab 10/27/16 1620 10/28/16 0342  AST 27 16  ALT 28 25  ALKPHOS 56 46  BILITOT 1.3* 1.1  PROT 6.2* 6.2*  ALBUMIN 3.6 3.3*   No results for input(s): LIPASE, AMYLASE in the last 168 hours. No results for input(s): AMMONIA in the last 168 hours. Coagulation Profile:  Recent Labs Lab 10/27/16 2342  INR 1.05   Cardiac Enzymes: No results for input(s): CKTOTAL, CKMB, CKMBINDEX, TROPONINI in the last 168 hours. BNP (last 3 results) No results for input(s): PROBNP in the last 8760 hours. HbA1C:  Recent  Labs  10/28/16 0342  HGBA1C 4.9   CBG:  Recent Labs Lab 10/27/16 2324 10/28/16 0801 10/28/16 1214  GLUCAP 149* 91 122*   Lipid Profile: No results for input(s): CHOL, HDL, LDLCALC, TRIG, CHOLHDL, LDLDIRECT in the last 72 hours. Thyroid Function Tests:  Recent Labs  10/28/16 0342  TSH 0.754   Anemia Panel: No results for input(s): VITAMINB12, FOLATE, FERRITIN, TIBC, IRON, RETICCTPCT in the last 72 hours. Sepsis Labs:  Recent Labs Lab 10/27/16 1635 10/27/16 2057 10/27/16 2342 10/28/16 0342  PROCALCITON  --   --  0.14  --   LATICACIDVEN 2.90* 1.62 1.6 1.8    Recent Results (from the past 240 hour(s))  Blood culture (routine x 2)     Status: None (Preliminary result)   Collection Time: 10/27/16  5:43 PM  Result Value Ref Range Status   Specimen Description BLOOD LEFT ANTECUBITAL  Final   Special Requests   Final    BOTTLES DRAWN AEROBIC AND ANAEROBIC Blood Culture adequate volume   Culture NO GROWTH < 24 HOURS  Final   Report Status PENDING  Incomplete  Blood culture (routine x 2)     Status: None (Preliminary result)   Collection Time: 10/27/16  6:37 PM  Result Value Ref Range Status   Specimen Description BLOOD LEFT HAND  Final   Special Requests   Final    BOTTLES DRAWN AEROBIC AND ANAEROBIC Blood Culture adequate volume   Culture NO GROWTH < 24 HOURS  Final   Report Status PENDING  Incomplete  MRSA PCR Screening     Status: None   Collection Time: 10/28/16  5:10 AM  Result Value Ref Range Status   MRSA by PCR NEGATIVE NEGATIVE Final    Comment:        The GeneXpert MRSA Assay (FDA approved for NASAL specimens only), is one component of a comprehensive MRSA colonization surveillance program. It is not intended to diagnose MRSA infection nor to guide or monitor treatment for MRSA infections.          Radiology Studies: Dg Chest 2 View  Result Date: 10/27/2016 CLINICAL DATA:  Chest pain.  History of sarcoidosis EXAM: CHEST  2 VIEW COMPARISON:   07/30/2016 FINDINGS: Heart size and vascularity normal.  Negative for adenopathy. Ill-defined patchy airspace disease right upper lobe is new. Displaced fractures of the right sixth and seventh ribs are new. No effusion or pneumothorax. IMPRESSION: Right rib fractures appear acute or subacute. Patchy right upper lobe infiltrate could represent pneumonia or focal airspace disease such as contusion. Electronically Signed   By: Marlan Palau M.D.   On: 10/27/2016 17:13   Dg Ankle 2 Views Left  Result Date: 10/27/2016 CLINICAL DATA:  Cellulitis. Redness and swelling about the left ankle for 1 week. Concern for  soft tissue air. EXAM: LEFT ANKLE - 2 VIEW COMPARISON:  None. FINDINGS: There is no evidence of fracture, dislocation, or joint effusion. There is no evidence of arthropathy or other focal bone abnormality. Diffuse soft tissue edema and skin thickening. No radiopaque foreign body. No soft tissue air. IMPRESSION: Diffuse soft tissue edema and skin thickening. No radiopaque foreign body, soft tissue air, or acute osseous abnormality. Electronically Signed   By: Rubye Oaks M.D.   On: 10/27/2016 22:10        Scheduled Meds: . amLODipine  5 mg Oral Daily  . azaTHIOprine  100 mg Oral QHS  . azaTHIOprine  50 mg Oral Daily  . famotidine  10 mg Oral BID  . insulin aspart  0-5 Units Subcutaneous QHS  . insulin aspart  0-9 Units Subcutaneous TID WC  . pantoprazole  40 mg Oral Daily  . predniSONE  50 mg Oral Q breakfast  . senna  1 tablet Oral BID  . sodium chloride flush  3 mL Intravenous Q12H   Continuous Infusions: . sodium chloride    . piperacillin-tazobactam (ZOSYN)  IV Stopped (10/28/16 0905)  . vancomycin Stopped (10/28/16 1100)     LOS: 1 day        Glade Lloyd, MD [facility] Pager [phone]  If 7PM-7AM, please contact night-coverage [email or web address] Password [email or web address] 10/28/2016, 1:50 PM

## 2016-10-28 NOTE — Patient Instructions (Signed)
Doxycycline 100 mg twice daily for 10 days. 1 g IM Rocephin. CBC drawn. Follow-up September 17.

## 2016-10-28 NOTE — Progress Notes (Signed)
Inpatient Diabetes Program Recommendations  AACE/ADA: New Consensus Statement on Inpatient Glycemic Control (2015)  Target Ranges:  Prepandial:   less than 140 mg/dL      Peak postprandial:   less than 180 mg/dL (1-2 hours)      Critically ill patients:  140 - 180 mg/dL   Results for NAZIAH, WECKERLY (MRN [id number]) as of 10/28/2016 10:21  Ref. Range 10/27/2016 23:24 10/28/2016 08:01  Glucose-Capillary Latest Ref Range: 65 - 99 mg/dL 409 (H) 91   Review of Glycemic Control  Diabetes history: None Outpatient Diabetes medications: None Current orders for Inpatient glycemic control: Novolog Sensitive Correction 0-9 units tid + Novolog HS scale.   A1c 4.9 on 10/28/16  Glucose 250's on admission, placed on Novolog Correction while inpatient in case of increase in glucose trends. Glucose 149 last night and 91 this am without insulin administration. Will watch trends and follow patient while here.  Thanks,  Christena Deem RN, MSN, [facility] Inpatient Diabetes Coordinator Team Pager [phone] (8a-5p)

## 2016-10-28 NOTE — Progress Notes (Signed)
*  PRELIMINARY RESULTS* Vascular Ultrasound Left lower extremity venous duplex has been completed.  Preliminary findings: No evidence of deep vein thrombosis or baker's cyst in the left lower extremity.   Chauncey Fischer 10/28/2016, 11:32 AM

## 2016-10-29 LAB — CBC WITH DIFFERENTIAL/PLATELET
Basophils Absolute: 0 10*3/uL (ref 0.0–0.1)
Basophils Relative: 0 %
EOS ABS: 0 10*3/uL (ref 0.0–0.7)
EOS PCT: 0 %
HCT: 34.9 % — ABNORMAL LOW (ref 39.0–52.0)
HEMOGLOBIN: 11.1 g/dL — AB (ref 13.0–17.0)
Lymphocytes Relative: 21 %
Lymphs Abs: 2.1 10*3/uL (ref 0.7–4.0)
MCH: 30.7 pg (ref 26.0–34.0)
MCHC: 31.8 g/dL (ref 30.0–36.0)
MCV: 96.7 fL (ref 78.0–100.0)
MONOS PCT: 8 %
Monocytes Absolute: 0.8 10*3/uL (ref 0.1–1.0)
NEUTROS PCT: 71 %
Neutro Abs: 7.1 10*3/uL (ref 1.7–7.7)
Platelets: 252 10*3/uL (ref 150–400)
RBC: 3.61 MIL/uL — AB (ref 4.22–5.81)
RDW: 13.6 % (ref 11.5–15.5)
WBC: 10 10*3/uL (ref 4.0–10.5)

## 2016-10-29 LAB — BASIC METABOLIC PANEL
Anion gap: 4 — ABNORMAL LOW (ref 5–15)
BUN: 14 mg/dL (ref 6–20)
CHLORIDE: 106 mmol/L (ref 101–111)
CO2: 29 mmol/L (ref 22–32)
CREATININE: 1.02 mg/dL (ref 0.61–1.24)
Calcium: 8.5 mg/dL — ABNORMAL LOW (ref 8.9–10.3)
GFR calc Af Amer: >60 mL/min (ref >60)
GFR calc non Af Amer: >60 mL/min (ref >60)
GLUCOSE: 113 mg/dL — AB (ref 65–99)
POTASSIUM: 3.4 mmol/L — AB (ref 3.5–5.1)
SODIUM: 139 mmol/L (ref 135–145)

## 2016-10-29 LAB — GLUCOSE, CAPILLARY: Glucose-Capillary: 104 mg/dL — ABNORMAL HIGH (ref 65–99)

## 2016-10-29 LAB — CALCITRIOL (1,25 DI-OH VIT D): Vit D, 1,25-Dihydroxy: 55.1 pg/mL (ref 19.9–79.3)

## 2016-10-29 LAB — MAGNESIUM: MAGNESIUM: 2.3 mg/dL (ref 1.7–2.4)

## 2016-10-29 MED ORDER — CEPHALEXIN 500 MG PO CAPS: 500.0000 mg | ORAL_CAPSULE | Freq: Three times a day (TID) | ORAL | 0 refills | Status: AC

## 2016-10-29 MED ORDER — HYDROCODONE-ACETAMINOPHEN 5-325 MG PO TABS: 1.0000 | ORAL_TABLET | Freq: Four times a day (QID) | ORAL | 0 refills | Status: DC | PRN

## 2016-10-29 MED ORDER — RANITIDINE HCL 150 MG PO TABS
150.0000 mg | ORAL_TABLET | Freq: Two times a day (BID) | ORAL | Status: DC
Start: 2016-10-29 — End: 2017-01-14

## 2016-10-29 MED ORDER — POTASSIUM CHLORIDE CRYS ER 20 MEQ PO TBCR
60.0000 meq | EXTENDED_RELEASE_TABLET | Freq: Once | ORAL | Status: AC
  Administered 2016-10-29: 60 meq via ORAL
  Filled 2016-10-29: qty 3

## 2016-10-29 MED ORDER — DOXYCYCLINE HYCLATE 100 MG PO TABS: 100.0000 mg | ORAL_TABLET | Freq: Two times a day (BID) | ORAL | 0 refills | Status: DC

## 2016-10-29 NOTE — Discharge Summary (Signed)
Physician Discharge Summary  RAYBURN MUNDIS ZOX:[id number] DOB: 25-Aug-1970 DOA: 10/27/2016  PCP: Margaree Mackintosh, MD  Admit date: 10/27/2016 Discharge date: 10/29/2016  Admitted From: home Disposition:  Home  Recommendations for Outpatient Follow-up:  1. 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 He was admitted with left ankle cellulitis and started on IV vancomycin and Zosyn. His cellulitis has much improved. He is afebrile. Cultures are negative so far. he will be discharged home on 5 more days of oral Keflex and doxycycline  Discharge Diagnoses:  Active Problems:   Essential hypertension   Neurosarcoidosis   Cellulitis of left ankle   Current chronic use of systemic steroids   Sepsis (HCC)   Hyperglycemia   Steroid-induced diabetes (HCC)   Right rib fracture   Cellulitis   Left ankle cellulitis - Patient is immunosuppressed. Currently on IV vancomycin and Zosyn - His cellulitis has much improved. He is afebrile. Cultures are negative so far. he will be discharged home on 5 more days of oral Keflex and doxycycline  Sepsis - Probably secondary to cellulitis. - resolved  Hypertension - Blood pressure stable. Continue Norvasc and Lasix at home  Leukocytosis - resolved  Neurosarcoidosis - Continue home steroid and azathioprine. Outpatient follow-up  Steroid-induced hyperglycemia - Monitor blood sugars  Right rib fracture - Questionable cause. Continue pain medications. Outpatient follow-up  Morbid obesity - Outpatient follow-up  Discharge Instructions  Discharge Instructions    Call MD for:  difficulty  breathing, headache or visual disturbances    Complete by:  As directed    Call MD for:  extreme fatigue    Complete by:  As directed    Call MD for:  hives    Complete by:  As directed    Call MD for:  persistant dizziness or light-headedness    Complete by:  As directed    Call MD for:  persistant nausea and vomiting    Complete by:  As directed    Call MD for:  severe uncontrolled pain    Complete by:  As directed    Call MD for:  temperature >100.4    Complete by:  As directed    Diet - low sodium heart healthy    Complete by:  As directed    Diet Carb Modified    Complete by:  As directed    Increase activity slowly    Complete by:  As directed      Allergies as of 10/29/2016   No Known Allergies     Medication List    STOP taking these medications   ibuprofen 200 MG tablet Commonly known as:  ADVIL,MOTRIN     TAKE these medications   amLODipine 5 MG tablet Commonly known as:  NORVASC Take 1 tablet (5 mg total) by mouth daily.   azaTHIOprine 50 MG tablet Commonly known as:  IMURAN Take 1 tablet in the morning and 2 tablets at night. What changed:  how much to take  how to take this  when to take this  additional instructions   cephALEXin 500 MG capsule Commonly known as:  KEFLEX Take 1 capsule (500 mg total) by mouth 3 (three) times  daily.   clotrimazole-betamethasone cream Commonly known as:  LOTRISONE Apply 1 application topically 2 (two) times daily. What changed:  when to take this  reasons to take this   cyclobenzaprine 10 MG tablet Commonly known as:  FLEXERIL Take 1 tablet (10 mg total) by mouth 3 (three) times daily as needed for muscle spasms.   docusate sodium 100 MG capsule Commonly known as:  COLACE Take 100 mg by mouth daily.   doxycycline 100 MG tablet Commonly known as:  VIBRA-TABS Take 1 tablet (100 mg total) by mouth 2 (two) times daily.   furosemide 40 MG tablet Commonly known as:  LASIX Take 1 tablet (40 mg total) by  mouth daily.   HYDROcodone-acetaminophen 5-325 MG tablet Commonly known as:  NORCO/VICODIN Take 1 tablet by mouth every 6 (six) hours as needed for moderate pain or severe pain.   ICY HOT ADVANCED RELIEF EX Apply 1 application topically every 6 (six) hours as needed (for pain or discomfort).   omeprazole 40 MG capsule Commonly known as:  PRILOSEC Take 1 capsule (40 mg total) by mouth daily. Start only if your are needing to take Zantac frequently and still having heartburn What changed:  when to take this  reasons to take this  additional instructions   phentermine 37.5 MG capsule Take 1 capsule (37.5 mg total) by mouth every morning.   predniSONE 20 MG tablet Commonly known as:  DELTASONE Take as directed What changed:  how much to take  how to take this  when to take this  additional instructions   ranitidine 150 MG tablet Commonly known as:  ZANTAC Take 1 tablet (150 mg total) by mouth 2 (two) times daily.            Discharge Care Instructions        Start     Ordered   10/29/16 0000  doxycycline (VIBRA-TABS) 100 MG tablet  2 times daily     10/29/16 0948   10/29/16 0000  HYDROcodone-acetaminophen (NORCO/VICODIN) 5-325 MG tablet  Every 6 hours PRN     10/29/16 0948   10/29/16 0000  ranitidine (ZANTAC) 150 MG tablet  2 times daily     10/29/16 0948   10/29/16 0000  cephALEXin (KEFLEX) 500 MG capsule  3 times daily     10/29/16 0948   10/29/16 0000  Increase activity slowly     10/29/16 0948   10/29/16 0000  Diet - low sodium heart healthy     10/29/16 0948   10/29/16 0000  Diet Carb Modified     10/29/16 0948   10/29/16 0000  Call MD for:  temperature >100.4     10/29/16 0948   10/29/16 0000  Call MD for:  persistant nausea and vomiting     10/29/16 0948   10/29/16 0000  Call MD for:  severe uncontrolled pain     10/29/16 0948   10/29/16 0000  Call MD for:  difficulty breathing, headache or visual disturbances     10/29/16 0948   10/29/16  0000  Call MD for:  hives     10/29/16 0948   10/29/16 0000  Call MD for:  persistant dizziness or light-headedness     10/29/16 0948   10/29/16 0000  Call MD for:  extreme fatigue     10/29/16 0948     Follow-up Information    Baxley, Luanna Cole, MD Follow up in 1 week(s).   Specialty:  Internal Medicine Contact information: [id number] [address] [phone]          No Known Allergies  Consultations:  none   Procedures/Studies: Dg Chest 2 View  Result Date: 10/27/2016 CLINICAL DATA:  Chest pain.  History of sarcoidosis EXAM: CHEST  2 VIEW COMPARISON:  07/30/2016 FINDINGS: Heart size and vascularity normal.  Negative for adenopathy. Ill-defined patchy airspace disease right upper lobe is new. Displaced fractures of the right sixth and seventh ribs are new. No effusion or pneumothorax. IMPRESSION: Right rib fractures appear acute or subacute. Patchy right upper lobe infiltrate could represent pneumonia or focal airspace disease such as contusion. Electronically Signed   By: Marlan Palau M.D.   On: 10/27/2016 17:13   Dg Ankle 2 Views Left  Result Date: 10/27/2016 CLINICAL DATA:  Cellulitis. Redness and swelling about the left ankle for 1 week. Concern for soft tissue air. EXAM: LEFT ANKLE - 2 VIEW COMPARISON:  None. FINDINGS: There is no evidence of fracture, dislocation, or joint effusion. There is no evidence of arthropathy or other focal bone abnormality. Diffuse soft tissue edema and skin thickening. No radiopaque foreign body. No soft tissue air. IMPRESSION: Diffuse soft tissue edema and skin thickening. No radiopaque foreign body, soft tissue air, or acute osseous abnormality. Electronically Signed   By: Rubye Oaks M.D.   On: 10/27/2016 22:10       Subjective: Patient seen and examined at bedside. He feels much better. His cellulitis has much improved. No overnight fever, nausea or vomiting  Discharge Exam: Vitals:   10/29/16 0224  10/29/16 0557  BP: 120/82 125/89  Pulse: 70 73  Resp: 18 20  Temp: 97.8 F (36.6 C) 97.8 F (36.6 C)  SpO2: 100% 98%   Vitals:   10/28/16 1437 10/28/16 2233 10/29/16 0224 10/29/16 0557  BP: (!) 141/86 (!) 155/82 120/82 125/89  Pulse: 90 92 70 73  Resp: 18 18 18 20   Temp: 98.2 F (36.8 C) 97.7 F (36.5 C) 97.8 F (36.6 C) 97.8 F (36.6 C)  TempSrc: Oral Oral Oral Oral  SpO2: 98% 95% 100% 98%  Weight:      Height:        General: Pt is alert, awake, not in acute distress Cardiovascular: rate controlled, S1/S2 + Respiratory: bilateral decreased breath sounds at bases Abdominal: Soft, NT, ND, bowel sounds + Extremities: Bilateral lower extremity 1+ edema with much improved left ankle erythema, no cyanosis    The results of significant diagnostics from this hospitalization (including imaging, microbiology, ancillary and laboratory) are listed below for reference.     Microbiology: Recent Results (from the past 240 hour(s))  Blood culture (routine x 2)     Status: None (Preliminary result)   Collection Time: 10/27/16  5:43 PM  Result Value Ref Range Status   Specimen Description BLOOD LEFT ANTECUBITAL  Final   Special Requests   Final    BOTTLES DRAWN AEROBIC AND ANAEROBIC Blood Culture adequate volume   Culture NO GROWTH < 24 HOURS  Final   Report Status PENDING  Incomplete  Blood culture (routine x 2)     Status: None (Preliminary result)   Collection Time: 10/27/16  6:37 PM  Result Value Ref Range Status   Specimen Description BLOOD LEFT HAND  Final   Special Requests   Final    BOTTLES DRAWN AEROBIC AND ANAEROBIC Blood Culture adequate volume   Culture NO GROWTH < 24 HOURS  Final   Report Status PENDING  Incomplete  MRSA PCR Screening  Status: None   Collection Time: 10/28/16  5:10 AM  Result Value Ref Range Status   MRSA by PCR NEGATIVE NEGATIVE Final    Comment:        The GeneXpert MRSA Assay (FDA approved for NASAL specimens only), is one component of  a comprehensive MRSA colonization surveillance program. It is not intended to diagnose MRSA infection nor to guide or monitor treatment for MRSA infections.      Labs: BNP (last 3 results) No results for input(s): BNP in the last 8760 hours. Basic Metabolic Panel:  Recent Labs Lab 10/27/16 1620 10/28/16 0342 10/29/16 0443  NA 136 140 139  K 4.3 4.0 3.4*  CL 100* 104 106  CO2 GLUCOSE 252* 107* 113*  BUN CREATININE 1.44* 1.10 1.02  CALCIUM 9.1 8.7* 8.5*  MG  --  2.3 2.3  PHOS  --  3.9  --    Liver Function Tests:  Recent Labs Lab 10/27/16 1620 10/28/16 0342  AST 27 16  ALT 28 25  ALKPHOS 56 46  BILITOT 1.3* 1.1  PROT 6.2* 6.2*  ALBUMIN 3.6 3.3*   No results for input(s): LIPASE, AMYLASE in the last 168 hours. No results for input(s): AMMONIA in the last 168 hours. CBC:  Recent Labs Lab 10/27/16 1620 10/28/16 0342 10/29/16 0443  WBC 19.8* 13.4* 10.0  NEUTROABS 18.4*  --  7.1  HGB 12.2* 12.0* 11.1*  HCT 36.8* 37.7* 34.9*  MCV 95.1 96.4 96.7  PLT 267 264 252   Cardiac Enzymes: No results for input(s): CKTOTAL, CKMB, CKMBINDEX, TROPONINI in the last 168 hours. BNP: Invalid input(s): POCBNP CBG:  Recent Labs Lab 10/28/16 0801 10/28/16 1214 10/28/16 1711 10/28/16 2227 10/29/16 0757  GLUCAP 91 122* 157* 189* 104*   D-Dimer No results for input(s): DDIMER in the last 72 hours. Hgb A1c  Recent Labs  10/28/16 0342  HGBA1C 4.9   Lipid Profile No results for input(s): CHOL, HDL, LDLCALC, TRIG, CHOLHDL, LDLDIRECT in the last 72 hours. Thyroid function studies  Recent Labs  10/28/16 0342  TSH 0.754   Anemia work up No results for input(s): VITAMINB12, FOLATE, FERRITIN, TIBC, IRON, RETICCTPCT in the last 72 hours. Urinalysis    Component Value Date/Time   COLORURINE YELLOW 10/27/2016 1955   APPEARANCEUR CLEAR 10/27/2016 1955   LABSPEC 1.026 10/27/2016 1955   PHURINE 6.0 10/27/2016 1955   GLUCOSEU >=500 (A)  10/27/2016 1955   HGBUR NEGATIVE 10/27/2016 1955   BILIRUBINUR NEGATIVE 10/27/2016 1955   BILIRUBINUR negative 10/14/2016 1628   KETONESUR NEGATIVE 10/27/2016 1955   PROTEINUR NEGATIVE 10/27/2016 1955   UROBILINOGEN 0.2 10/14/2016 1628   NITRITE NEGATIVE 10/27/2016 1955   LEUKOCYTESUR NEGATIVE 10/27/2016 1955   Sepsis Labs Invalid input(s): PROCALCITONIN,  WBC,  LACTICIDVEN Microbiology Recent Results (from the past 240 hour(s))  Blood culture (routine x 2)     Status: None (Preliminary result)   Collection Time: 10/27/16  5:43 PM  Result Value Ref Range Status   Specimen Description BLOOD LEFT ANTECUBITAL  Final   Special Requests   Final    BOTTLES DRAWN AEROBIC AND ANAEROBIC Blood Culture adequate volume   Culture NO GROWTH < 24 HOURS  Final   Report Status PENDING  Incomplete  Blood culture (routine x 2)     Status: None (Preliminary result)   Collection Time: 10/27/16  6:37 PM  Result Value Ref Range Status   Specimen Description BLOOD LEFT HAND  Final  Special Requests   Final    BOTTLES DRAWN AEROBIC AND ANAEROBIC Blood Culture adequate volume   Culture NO GROWTH < 24 HOURS  Final   Report Status PENDING  Incomplete  MRSA PCR Screening     Status: None   Collection Time: 10/28/16  5:10 AM  Result Value Ref Range Status   MRSA by PCR NEGATIVE NEGATIVE Final    Comment:        The GeneXpert MRSA Assay (FDA approved for NASAL specimens only), is one component of a comprehensive MRSA colonization surveillance program. It is not intended to diagnose MRSA infection nor to guide or monitor treatment for MRSA infections.      Time coordinating discharge: 35 minutes  SIGNED:   Glade Lloyd, MD  [facility] 10/29/2016, 9:48 AM Pager: [phone]  If 7PM-7AM, please contact night-coverage [email or web address] Password [email or web address]

## 2016-10-29 NOTE — Progress Notes (Signed)
Discharge paperwork given to patient. Prescription given. No questions verbalized.  

## 2016-10-29 NOTE — Evaluation (Signed)
Physical Therapy Evaluation Patient Details Name: Adrian [name] MRN: [id number] DOB: [birth year] Today's Date: 10/29/2016   History of Present Illness  Pt is a 46 y.o. 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 Other pertinent PMH includes L eye nerve palsy, HTN.     Clinical Impression  Patient evaluated by Physical Therapy with no further acute PT needs identified. Pt with PMH significant for neurosarcoidosis, which is apparent with increased lateral sway and decreased depth perception during gait training. Pt with good safety awareness and activity pacing; mod indep for amb 600' and ascending/descending steps with hand rail. Discussed potential for [facility] use to improve stability, but pt reports he is not ready for an AD yet. Also discussed benefit of neuro outpatient PT, which pt is open to. All education has been completed and pt has no further questions. Acute PT is signing off. Thank you for this referral.    Follow Up Recommendations Outpatient PT (Neuro)    Equipment Recommendations  None recommended by PT    Recommendations for Other Services       Precautions / Restrictions Precautions Precautions: Fall Restrictions Weight Bearing Restrictions: No      Mobility  Bed Mobility Overal bed mobility: Independent                Transfers Overall transfer level: Independent Equipment used: None                Ambulation/Gait Ambulation/Gait assistance: Modified independent (Device/Increase time) Ambulation Distance (Feet): 600 Feet   Gait Pattern/deviations: Step-through pattern;Decreased stride length;Drifts right/left Gait velocity: Decreased Gait velocity interpretation: <1.8 ft/sec, indicative of risk for recurrent falls General Gait Details: Pt with history of neurosarcoidosis, which is reflected in his gait. Decreased speed and stride length, in addition to occasional drifting  R/L as pt reports decreased depth perception. Intermittent increased sway, but no LOB. Pt occasionally reaching for hand rail with static standing. Discussed use of SPC, but reports not ready for an AD just yet; is open to this if condition progresses  Stairs Stairs: Yes Stairs assistance: Modified independent (Device/Increase time) Stair Management: One rail Left;Alternating pattern;Forwards Number of Stairs: 2 General stair comments: Educ for safety  Wheelchair Mobility    Modified Rankin (Stroke Patients Only)       Balance Overall balance assessment: Needs assistance Sitting-balance support: No upper extremity supported;Feet unsupported;Feet supported Sitting balance-Leahy Scale: Good     Standing balance support: No upper extremity supported;During functional activity Standing balance-Leahy Scale: Good                               Pertinent Vitals/Pain Pain Assessment: Faces Faces Pain Scale: Hurts a little bit Pain Location: R rib fx site Pain Descriptors / Indicators: Discomfort;Sore Pain Intervention(s): Monitored during session    Home Living Family/patient expects to be discharged to:: Private residence Living Arrangements: Spouse/significant other Available Help at Discharge: Family;Available PRN/intermittently Type of Home: House Home Access: Stairs to enter Entrance Stairs-Rails: Left Entrance Stairs-Number of Steps: 2 Home Layout: One level Home Equipment: Walker - 2 wheels;Cane - single point;Bedside commode;Shower seat      Prior Function Level of Independence: Independent         Comments: Works in Psychologist, educational        Extremity/Trunk Assessment   Upper Extremity Assessment Upper Extremity Assessment: Overall WFL for tasks  assessed    Lower Extremity Assessment Lower Extremity Assessment: Overall WFL for tasks assessed       Communication   Communication: No difficulties  Cognition  Arousal/Alertness: Awake/alert Behavior During Therapy: WFL for tasks assessed/performed Overall Cognitive Status: Within Functional Limits for tasks assessed                                        General Comments      Exercises     Assessment/Plan    PT Assessment Patent does not need any further PT services  PT Problem List         PT Treatment Interventions      PT Goals (Current goals can be found in the Care Plan section)  Acute Rehab PT Goals Patient Stated Goal: Return home PT Goal Formulation: With patient Time For Goal Achievement: 11/12/16 Potential to Achieve Goals: Good    Frequency     Barriers to discharge        Co-evaluation               AM-PAC PT "6 Clicks" Daily Activity  Outcome Measure Difficulty turning over in bed (including adjusting bedclothes, sheets and blankets)?: None Difficulty moving from lying on back to sitting on the side of the bed? : None Difficulty sitting down on and standing up from a chair with arms (e.g., wheelchair, bedside commode, etc,.)?: None Help needed moving to and from a bed to chair (including a wheelchair)?: None Help needed walking in hospital room?: None Help needed climbing 3-5 steps with a railing? : None 6 Click Score: 24    End of Session Equipment Utilized During Treatment: Gait belt Activity Tolerance: Patient tolerated treatment well Patient left: in bed;with call bell/phone within reach;with family/visitor present Nurse Communication: Mobility status PT Visit Diagnosis: Unsteadiness on feet (R26.81);Other abnormalities of gait and mobility (R26.89)    Time: 1031-1050 PT Time Calculation (min) (ACUTE ONLY): 19 min   Charges:   PT Evaluation $PT Eval Low Complexity: 1 Low     PT G Codes:       Adrian [name], PT, DPT Acute Rehab Services  Pager: [phone]  Adrian [name] 10/29/2016, 10:57 AM

## 2016-11-01 LAB — CULTURE, BLOOD (ROUTINE X 2)
CULTURE: NO GROWTH
CULTURE: NO GROWTH
Special Requests: ADEQUATE
Special Requests: ADEQUATE

## 2016-11-05 ENCOUNTER — Encounter: Payer: Self-pay | Admitting: Internal Medicine

## 2016-11-05 ENCOUNTER — Ambulatory Visit (INDEPENDENT_AMBULATORY_CARE_PROVIDER_SITE_OTHER): Payer: 59 | Admitting: Internal Medicine

## 2016-11-05 VITALS — BP 170/88 | HR 91 | Temp 97.4°F | Wt 269.0 lb

## 2016-11-05 DIAGNOSIS — S2241XD Multiple fractures of ribs, right side, subsequent encounter for fracture with routine healing: Secondary | ICD-10-CM | POA: Diagnosis not present

## 2016-11-05 DIAGNOSIS — E876 Hypokalemia: Secondary | ICD-10-CM | POA: Diagnosis not present

## 2016-11-05 DIAGNOSIS — D849 Immunodeficiency, unspecified: Secondary | ICD-10-CM

## 2016-11-05 DIAGNOSIS — D869 Sarcoidosis, unspecified: Secondary | ICD-10-CM | POA: Diagnosis not present

## 2016-11-05 DIAGNOSIS — I10 Essential (primary) hypertension: Secondary | ICD-10-CM

## 2016-11-05 DIAGNOSIS — D899 Disorder involving the immune mechanism, unspecified: Principal | ICD-10-CM

## 2016-11-05 DIAGNOSIS — H4922 Sixth [abducent] nerve palsy, left eye: Secondary | ICD-10-CM

## 2016-11-05 DIAGNOSIS — D8689 Sarcoidosis of other sites: Secondary | ICD-10-CM

## 2016-11-05 LAB — BASIC METABOLIC PANEL WITH GFR
BUN / CREAT RATIO: 13 (calc) (ref 6–22)
BUN: 18 mg/dL (ref 7–25)
CO2: 28 mmol/L (ref 20–32)
CREATININE: 1.4 mg/dL — AB (ref 0.60–1.35)
Calcium: 9.2 mg/dL (ref 8.6–10.3)
Chloride: 102 mmol/L (ref 98–110)
GFR, EST AFRICAN AMERICAN: 69 mL/min/{1.73_m2} (ref >=60)
GFR, EST NON AFRICAN AMERICAN: 59 mL/min/{1.73_m2} — AB (ref >=60)
GLUCOSE: 131 mg/dL — AB (ref 65–99)
Potassium: 3.5 mmol/L (ref 3.5–5.3)
SODIUM: 141 mmol/L (ref 135–146)

## 2016-11-05 MED ORDER — MUPIROCIN 2 % EX OINT: 1.0000 "application " | TOPICAL_OINTMENT | Freq: Two times a day (BID) | CUTANEOUS | 0 refills | Status: DC

## 2016-11-05 MED ORDER — LOSARTAN POTASSIUM 100 MG PO TABS: 100.0000 mg | ORAL_TABLET | Freq: Every day | ORAL | 3 refills | Status: DC

## 2016-11-05 NOTE — Patient Instructions (Signed)
Add losartan 100 mg daily to current antihypertensive regimen and return in one week. Have bone density study and CT of the chest with contrast.

## 2016-11-05 NOTE — Progress Notes (Signed)
Subjective:    Patient ID: Adrian [name], male    DOB: 1971-01-09, 46 y.o.   MRN: [id number]  HPI  46 year old White Male with history of Neurosarcoidosis maintained currently on steroids and Imuran in today to follow-up on recent hospitalization.  He was admitted on September 26 and discharged on September 28. He had developed left ankle cellulitis and was started on IV vancomycin and Zosyn. Cultures were negative. He was discharged home on oral X and doxycycline for 5 days.  On September 17 he was treated for cellulitis left elbow with Rocephin 1 g IM x 1 and doxycycline for 10 days.  He is being followed at Dr. Gaynell Face, Neurologist at [facility]. Remicade is to be started next week.  He recently had abnormal chest x-ray in the hospital revealing rib fractures right sixth and seventh ribs which were new. There was no evidence of pneumothorax. However there was a patchy right upper lobe infiltrate thought to represent pneumonia or focal air space disease such as contusion. Patient denies falling did not realize he had rib fractures. He had pain right lateral rib cage. He has been working a lot outdoors. This will probably need to be curtailed with his current immune status. He has multiple scratches on extremities. It would be easy for him to develop cellulitis/sepsis in this current immunocompromised state. He says he will cut back on this work.  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 I do not see him returning to work for several months.  His blood pressure remains elevated. He was started on losartan 100 mg daily in addition to Lasix 40 mg daily and amlodipine 5 mg daily. He'll return in one week for follow-up and basic metabolic panel. His potassium was slightly low in the hospital.  Despite Lasix 40 mg daily continues with mild ankle edema.  Apparently Imuran will be discontinued when he starts Remicade according to fiance.  We have decided not to give him flu vaccine  at the present time.  He is going to have bone density study in the near future due to recent rib fractures and a CT of the chest with contrast to further delineate? Ill-defined density in right lung that was identified on recent chest x-ray. My guess is high-dose steroids have led to bone loss. Doesn't recall any trauma to ribs.  History of steroid-induced hyperglycemia. Hemoglobin A1c checked September 27 was 4.9%.    Review of Systems no new complaints.  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   No murmur heard. Pulmonary/Chest: Effort normal and breath sounds normal. No respiratory distress. He has no wheezes. He has no rales.  Musculoskeletal:  1+ nonpitting edema of the lower extremities  Skin: Skin is warm and dry.  Multiple superficial scratches and abraded areas upper and lower extremities  Psychiatric: He has a normal mood and affect. His behavior is normal. Judgment and thought content normal.          Assessment & Plan:  Cellulitis of left ankle-resolved. Portal of entry seems to be on abraded area perhaps insect bite left anterior ankle.  Recent cellulitis left elbow with skin abraded areas likely portal of entry.  Elevated blood pressure-add losartan 100 mg daily to Lasix 40 mg daily and amlodipine 5 mg daily. Return in one week for office visit  blood pressure check and basic metabolic panel.Blood pressure today is 170/88.  Immunocompromised-currently on Imuran and high-dose steroids for neurosarcoidosis. Start Remicade next week at [facility].He is going to be prescribed mupirocin to put in nose nightly. He is to bathe in Hibiclens from the neck down daily  Rib fractures and abnormal chest x-ray-to have CT of chest with contrast to further evaluate ill-defined density in right lung. To have bone density  study regarding recent rib fractures without significant trauma.  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

## 2016-11-06 LAB — VITAMIN D 25 HYDROXY (VIT D DEFICIENCY, FRACTURES): Vit D, 25-Hydroxy: 33 ng/mL (ref 30–100)

## 2016-11-08 ENCOUNTER — Encounter: Payer: Self-pay | Admitting: Internal Medicine

## 2016-11-08 ENCOUNTER — Ambulatory Visit
Admission: RE | Admit: 2016-11-08 | Discharge: 2016-11-08 | Disposition: A | Discharge status: Home / Self Care | Payer: 59 | Source: Ambulatory Visit | Attending: Internal Medicine | Admitting: Internal Medicine

## 2016-11-08 ENCOUNTER — Other Ambulatory Visit: Payer: Self-pay | Admitting: Internal Medicine

## 2016-11-08 DIAGNOSIS — D869 Sarcoidosis, unspecified: Secondary | ICD-10-CM

## 2016-11-08 DIAGNOSIS — S2249XA Multiple fractures of ribs, unspecified side, initial encounter for closed fracture: Secondary | ICD-10-CM

## 2016-11-08 DIAGNOSIS — S2241XD Multiple fractures of ribs, right side, subsequent encounter for fracture with routine healing: Secondary | ICD-10-CM | POA: Diagnosis not present

## 2016-11-08 MED ORDER — IOPAMIDOL (ISOVUE-300) INJECTION 61%
75.0000 mL | Freq: Once | INTRAVENOUS | Status: AC | PRN
  Administered 2016-11-08: 75 mL via INTRAVENOUS

## 2016-11-09 DIAGNOSIS — D869 Sarcoidosis, unspecified: Secondary | ICD-10-CM | POA: Diagnosis not present

## 2016-11-10 ENCOUNTER — Other Ambulatory Visit: Payer: 59

## 2016-11-15 ENCOUNTER — Encounter: Payer: Self-pay | Admitting: Internal Medicine

## 2016-11-15 ENCOUNTER — Ambulatory Visit (INDEPENDENT_AMBULATORY_CARE_PROVIDER_SITE_OTHER): Payer: 59 | Admitting: Internal Medicine

## 2016-11-15 VITALS — BP 140/90 | HR 97 | Temp 97.8°F | Wt 268.0 lb

## 2016-11-15 DIAGNOSIS — S60511A Abrasion of right hand, initial encounter: Secondary | ICD-10-CM | POA: Diagnosis not present

## 2016-11-15 DIAGNOSIS — R7989 Other specified abnormal findings of blood chemistry: Secondary | ICD-10-CM

## 2016-11-15 DIAGNOSIS — M545 Low back pain, unspecified: Secondary | ICD-10-CM

## 2016-11-15 DIAGNOSIS — I10 Essential (primary) hypertension: Secondary | ICD-10-CM | POA: Diagnosis not present

## 2016-11-15 DIAGNOSIS — D8689 Sarcoidosis of other sites: Secondary | ICD-10-CM | POA: Diagnosis not present

## 2016-11-15 LAB — BASIC METABOLIC PANEL WITH GFR
BUN: 25 mg/dL (ref 7–25)
CALCIUM: 9.4 mg/dL (ref 8.6–10.3)
CHLORIDE: 100 mmol/L (ref 98–110)
CO2: 31 mmol/L (ref 20–32)
Creat: 1.11 mg/dL (ref 0.60–1.35)
GFR, EST AFRICAN AMERICAN: 91 mL/min/{1.73_m2} (ref >=60)
GFR, EST NON AFRICAN AMERICAN: 79 mL/min/{1.73_m2} (ref >=60)
Glucose, Bld: 127 mg/dL — ABNORMAL HIGH (ref 65–99)
Potassium: 4.1 mmol/L (ref 3.5–5.3)
Sodium: 138 mmol/L (ref 135–146)

## 2016-11-15 MED ORDER — DOXYCYCLINE HYCLATE 100 MG PO TABS: 100.0000 mg | ORAL_TABLET | Freq: Two times a day (BID) | ORAL | 0 refills | Status: DC

## 2016-11-15 MED ORDER — OLMESARTAN MEDOXOMIL 20 MG PO TABS: 20.0000 mg | ORAL_TABLET | Freq: Every day | ORAL | 1 refills | Status: DC

## 2016-11-15 NOTE — Patient Instructions (Addendum)
Doxycycline 100 mg twice a day x 10 days. Change Losartan to Benicar. Have Xray of LS spine.  See endocrine regarding rib fractures.  May be a candidate for bone sparing therapy.  Take 2000 units vitamin D3 daily.

## 2016-11-15 NOTE — Progress Notes (Signed)
   Subjective:    Patient ID: Adrian [name], male    DOB: 01/19/1971, 46 y.o.   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   Had immunization for Tetanus 2015.   Has abrasion right hand.  It does not appear to be infected.  Low back pain. To get bone density soon  Abnormal CXR but CT showed stable 4mm lingular nodule.  He was also found to have healing rib fracture right fifth through ninth.  We will order bone density study.  He is on high-dose steroids which likely will lead to osteopenia/osteoporosis.  He had no evidence of mediastinal hilar or axillary adenopathy on CT.  Blood pressure remains elevated.  He will be started on Benicar 20 mg daily in addition to amlodipine and Lasix with follow-up in a couple of weeks.  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  Has been on ranitidine for reflux symptoms.  Is on amlodipine and Lasix for high blood pressure.  Blood pressure today is 140/90.    Hemoglobin A1c checked September 27 was 4.9%.    This will be followed.  Vitamin D level on October 5 was 33.  He should take 2000 units vitamin D3 daily.   In late September, he had elevated serum creatinine of 1.44. We have been following this.  Creatinine today is 1.11.  Neysa Bonito is watching his glucose.      Review of Systems see above. Flat affect. Fiance says at times he is angry and frustrated and takes it out on her.     Objective:   Physical Exam  Cardiovascular: Normal rate, regular rhythm and normal heart sounds.   Pulmonary/Chest: Effort normal and breath sounds normal. No respiratory distress. He has no wheezes.  Tender right rib cage area  Musculoskeletal:  Trace lower extremity edema  Vitals reviewed.         Assessment & Plan:  Neurosarcoidosis treated with p.o. steroids and IV Remicade per Dr. Gaynell Face at [facility].  Manifestations include left 6th nerve palsy which is not  improved.  Immunocompromised with steroid therapy and has had cellulitis issues.  Needs to be careful about minor accidents and injuries  Rib fractures secondary to steroid therapy.  Vitamin D level is normal.  Recommend 2000 units vitamin D3 daily.  Will ask him to see endocrinologist to see if he needs to be on bone sparing medication.  Essential hypertension still not well controlled add Benicar 20 mg daily to Lasix and amlodipine.  Discontinue losartan.  For low back pain will have LS spine films.  Regarding abrasion on hand he will be started on doxycycline 100 mg twice daily for 10 days hopefully to avoid cellulitis  Follow-up in 2 weeks

## 2016-11-16 ENCOUNTER — Telehealth: Payer: Self-pay | Admitting: Internal Medicine

## 2016-11-16 ENCOUNTER — Other Ambulatory Visit: Payer: 59

## 2016-11-16 DIAGNOSIS — R0781 Pleurodynia: Secondary | ICD-10-CM

## 2016-11-16 NOTE — Telephone Encounter (Signed)
Adrian [name] states that he was sitting on the couch last night and literally just leaned over on the pillow with his left arm and heard something crack on his left side (just like it did with the right side).  He has been in pain ever since.  She is concerned that perhaps he could have more broken ribs like he did on the right side??    She was getting ready to call and schedule the x-ray for his back and wanted to know if you thought it was feasible to do an x-ray of that area since this has happened?  It's the left rib region.    Best # for contact:  [phone]  Thank you.

## 2016-11-16 NOTE — Telephone Encounter (Signed)
Pt is aware.  

## 2016-11-16 NOTE — Telephone Encounter (Signed)
He has bone density study scheduled. Go ahead and order left rib detail films for today

## 2016-11-18 ENCOUNTER — Ambulatory Visit
Admission: RE | Admit: 2016-11-18 | Discharge: 2016-11-18 | Disposition: A | Discharge status: Home / Self Care | Payer: 59 | Source: Ambulatory Visit | Attending: Internal Medicine | Admitting: Internal Medicine

## 2016-11-18 DIAGNOSIS — R296 Repeated falls: Secondary | ICD-10-CM | POA: Diagnosis not present

## 2016-11-18 DIAGNOSIS — R0781 Pleurodynia: Secondary | ICD-10-CM | POA: Diagnosis not present

## 2016-11-22 DIAGNOSIS — D869 Sarcoidosis, unspecified: Secondary | ICD-10-CM | POA: Diagnosis not present

## 2016-11-23 ENCOUNTER — Encounter: Payer: Self-pay | Admitting: Internal Medicine

## 2016-11-26 ENCOUNTER — Telehealth: Payer: Self-pay

## 2016-11-26 ENCOUNTER — Ambulatory Visit
Admission: RE | Admit: 2016-11-26 | Discharge: 2016-11-26 | Disposition: A | Discharge status: Home / Self Care | Payer: 59 | Source: Ambulatory Visit | Attending: Internal Medicine | Admitting: Internal Medicine

## 2016-11-26 DIAGNOSIS — D869 Sarcoidosis, unspecified: Secondary | ICD-10-CM

## 2016-11-26 DIAGNOSIS — S2249XA Multiple fractures of ribs, unspecified side, initial encounter for closed fracture: Secondary | ICD-10-CM

## 2016-11-26 DIAGNOSIS — M8589 Other specified disorders of bone density and structure, multiple sites: Secondary | ICD-10-CM

## 2016-11-26 NOTE — Telephone Encounter (Signed)
-----   Message from Margaree MackintoshMary J Baxley, MD sent at 11/26/2016  9:19 AM EDT ----- Refer to Dr. Lucianne MussKumar for osteopenia. Bone density shows osteopenia likely related to steroids.

## 2016-11-29 ENCOUNTER — Ambulatory Visit (INDEPENDENT_AMBULATORY_CARE_PROVIDER_SITE_OTHER): Payer: 59 | Admitting: Internal Medicine

## 2016-11-29 ENCOUNTER — Encounter: Payer: Self-pay | Admitting: Internal Medicine

## 2016-11-29 VITALS — BP 136/90 | HR 110 | Temp 97.5°F | Wt 283.0 lb

## 2016-11-29 DIAGNOSIS — I10 Essential (primary) hypertension: Secondary | ICD-10-CM

## 2016-11-29 DIAGNOSIS — K219 Gastro-esophageal reflux disease without esophagitis: Secondary | ICD-10-CM | POA: Diagnosis not present

## 2016-11-29 DIAGNOSIS — R0789 Other chest pain: Secondary | ICD-10-CM | POA: Diagnosis not present

## 2016-11-29 DIAGNOSIS — M8589 Other specified disorders of bone density and structure, multiple sites: Secondary | ICD-10-CM

## 2016-11-29 DIAGNOSIS — H4922 Sixth [abducent] nerve palsy, left eye: Secondary | ICD-10-CM

## 2016-11-29 DIAGNOSIS — D8689 Sarcoidosis of other sites: Secondary | ICD-10-CM

## 2016-11-29 MED ORDER — PANTOPRAZOLE SODIUM 40 MG PO TBEC: 40.0000 mg | DELAYED_RELEASE_TABLET | Freq: Every day | ORAL | 0 refills | Status: DC

## 2016-11-29 MED ORDER — OLMESARTAN MEDOXOMIL 40 MG PO TABS: 40.0000 mg | ORAL_TABLET | Freq: Every day | ORAL | 0 refills | Status: DC

## 2016-11-29 NOTE — Patient Instructions (Signed)
Increase Benicar to 40 mg daily and continue other medications as previously prescribed.  Return in 2 weeks office visit and basic metabolic panel.  To see endocrinologist regarding treatment for osteopenia.  Try PPI for possible GE reflux.

## 2016-11-29 NOTE — Progress Notes (Signed)
   Subjective:    Patient ID: Adrian [name], male    DOB: 07/29/1970, 46 y.o.   MRN: [id number]  HPI 46 year old for follow up of HTN. [facility]ist does not want him to receive vaccines. [facility] Neurologist, Dr.Eckstein.  Continues with Remicade infusions.  On Benicar 20 mg daily  for HTN and amlodipine as well as lasix 40 mg daily.  Has been sluggish after recent treatment with Remicade.  BP 136/90 and running about the same at home.  Will increase Benicar to 40 mg daily.  He has have right rib fractures 5 through 9 with very minor trauma.  Bone density study showing osteopenia with -2.1 score.  When he returns, we should check vitamin D level.  We are going to send him to endocrinologist to see if he needs to be on treatment.  He continues with some right-sided rib pain.  He has had  cellulitis of his elbow while on high-dose steroid therapy.  He was admitted with cellulitis of his ankle September 26 treated with vancomycin and Zosyn.  He was discharged home on Keflex.  Continues with left 6th nerve palsy and continues to wear eye patch.    Review of Systems see above     Objective:   Physical Exam Skin warm and dry.  Nodes none.  Neck supple.  Chest clear.  Cardiac exam regular rate and rhythm.  Extremities without pitting edema.  Continues with some right sided rib tenderness.       Assessment & Plan:  Hypertension -increase Benicar to 40 mg daily and return in 2 weeks  Right rib fractures exacerbated by high-dose steroids now with osteopenia to see endocrinologist in the near future.  Check vitamin D level upon return.  Neurosarcoidosis currently being treated with steroids and Remicade.  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  Start Protonix 40 mg daily.  Could be exacerbated by steroids.  He has been on phentermine per  local neurologist but I am not willing to refill this at this point in time.  It may be aggravating his hypertension.  I think it was to help with weight gain with steroid therapy.

## 2016-12-03 ENCOUNTER — Other Ambulatory Visit: Payer: Self-pay | Admitting: Neurology

## 2016-12-10 ENCOUNTER — Encounter: Payer: Self-pay | Admitting: Internal Medicine

## 2016-12-10 MED ORDER — CYCLOBENZAPRINE HCL 10 MG PO TABS: 10.0000 mg | ORAL_TABLET | Freq: Three times a day (TID) | ORAL | 3 refills | Status: DC | PRN

## 2016-12-13 ENCOUNTER — Telehealth: Payer: Self-pay

## 2016-12-13 MED ORDER — HYDROCODONE-ACETAMINOPHEN 5-325 MG PO TABS: 1.0000 | ORAL_TABLET | Freq: Four times a day (QID) | ORAL | 0 refills | Status: DC | PRN

## 2016-12-13 NOTE — Telephone Encounter (Signed)
Refilld Hydrocodone per Dr. Beryle QuantBaxley's request

## 2016-12-14 ENCOUNTER — Encounter: Payer: Self-pay | Admitting: Internal Medicine

## 2016-12-14 ENCOUNTER — Ambulatory Visit (INDEPENDENT_AMBULATORY_CARE_PROVIDER_SITE_OTHER): Payer: 59 | Admitting: Internal Medicine

## 2016-12-14 VITALS — BP 130/90 | HR 74 | Temp 97.1°F | Wt 274.0 lb

## 2016-12-14 DIAGNOSIS — M545 Low back pain, unspecified: Secondary | ICD-10-CM

## 2016-12-14 DIAGNOSIS — G8929 Other chronic pain: Secondary | ICD-10-CM

## 2016-12-14 DIAGNOSIS — I10 Essential (primary) hypertension: Secondary | ICD-10-CM

## 2016-12-14 DIAGNOSIS — S60512A Abrasion of left hand, initial encounter: Secondary | ICD-10-CM

## 2016-12-14 DIAGNOSIS — D8689 Sarcoidosis of other sites: Secondary | ICD-10-CM

## 2016-12-14 DIAGNOSIS — M8589 Other specified disorders of bone density and structure, multiple sites: Secondary | ICD-10-CM | POA: Diagnosis not present

## 2016-12-14 MED ORDER — DOXYCYCLINE HYCLATE 100 MG PO TABS: 100.0000 mg | ORAL_TABLET | Freq: Two times a day (BID) | ORAL | 0 refills | Status: DC

## 2016-12-14 MED ORDER — CARVEDILOL 6.25 MG PO TABS: 6.2500 mg | ORAL_TABLET | Freq: Two times a day (BID) | ORAL | 3 refills | Status: DC

## 2016-12-14 NOTE — Progress Notes (Signed)
   Subjective:    Patient ID: Adrian [name], male    DOB: 09/02/1970, 46 y.o.   MRN: [id number]  HPI He was last here October 29. [facility]. At last visit Benicar increased to 40 mg daily for BP control. [facility] more hydrocodone APAP being prescribed.  Currently on Benicar 40, Lasix 40, and amlodipine 5 mg daily. EKG in September showed no LVH.    Review of Systems no new complaints     Objective:   Physical Exam BP 130/90. Weight 274 pounds. Was 283  Pounds at last visit. Was 268 in early October and 273 in July. Neck supple.  Chest clear.  Cardiac exam regular rate and rhythm.  Continues with some lower extremity edema.      Assessment & Plan:  Hypertension and Coreg 6.25 mg twice daily to current regimen and follow-up in 4 weeks  Neurosarcoidosis-[facility]  Rib fracture secondary to high-dose steroid use with minimal trauma.  Bone density study showed osteopenia with T score -2.1 in the LS spine -1.0 in femoral neck.  We will ask him to see endocrinologist regarding possible treatment with bone sparing medication.  He is on Rem[facility]ade for neurosarcoidosis at [facility].   High-dose steroid use  Musculoskeletal pain-chronic low back pain-refill hydrocodone APAP 5/325 #20  Abrasion left hand-treat with doxycycline.  History of cellulitis due to minor abrasions and steroid use  Plan: He used hydrocodone APAP appropriately.  We gave him a refill on this today.  His pain is well controlled with that and he takes it sparingly.

## 2016-12-15 ENCOUNTER — Telehealth: Payer: Self-pay

## 2016-12-15 LAB — BASIC METABOLIC PANEL WITH GFR
BUN: 21 mg/dL (ref 7–25)
CALCIUM: 9.5 mg/dL (ref 8.6–10.3)
CO2: 26 mmol/L (ref 20–32)
CREATININE: 1.26 mg/dL (ref 0.60–1.35)
Chloride: 107 mmol/L (ref 98–110)
GFR, EST NON AFRICAN AMERICAN: 67 mL/min/{1.73_m2} (ref >=60)
GFR, Est African American: 78 mL/min/{1.73_m2} (ref >=60)
GLUCOSE: 95 mg/dL (ref 65–99)
Potassium: 3.8 mmol/L (ref 3.5–5.3)
Sodium: 149 mmol/L — ABNORMAL HIGH (ref 135–146)

## 2016-12-15 LAB — VITAMIN D 25 HYDROXY (VIT D DEFICIENCY, FRACTURES): VIT D 25 HYDROXY: 33 ng/mL (ref 30–100)

## 2016-12-15 NOTE — Telephone Encounter (Signed)
LMTCB

## 2016-12-15 NOTE — Telephone Encounter (Signed)
-----   Message from Margaree MackintoshMary J Baxley, MD sent at 12/15/2016 10:50 AM EST ----- Creatinine stable. Vitamin D is OK- continue to take some Vitamin D OTC

## 2016-12-15 NOTE — Telephone Encounter (Signed)
Pt is aware.  

## 2016-12-21 DIAGNOSIS — D869 Sarcoidosis, unspecified: Secondary | ICD-10-CM | POA: Diagnosis not present

## 2016-12-26 NOTE — Patient Instructions (Signed)
Doxycycline for abrasion left hand.  History of cellulitis.  To see endocrinologist regarding osteopenia in the face of high steroid use.  Add Coreg to current antihypertensive regimen.  Hydrocodone APAP refilled which he will take sparingly for back/musculoskeletal pain.

## 2016-12-28 ENCOUNTER — Ambulatory Visit (INDEPENDENT_AMBULATORY_CARE_PROVIDER_SITE_OTHER): Payer: 59 | Admitting: Internal Medicine

## 2016-12-28 ENCOUNTER — Encounter: Payer: Self-pay | Admitting: Internal Medicine

## 2016-12-28 VITALS — BP 112/88 | HR 127 | Temp 99.8°F | Wt 280.0 lb

## 2016-12-28 DIAGNOSIS — N452 Orchitis: Secondary | ICD-10-CM

## 2016-12-28 DIAGNOSIS — N5089 Other specified disorders of the male genital organs: Secondary | ICD-10-CM

## 2016-12-28 DIAGNOSIS — N509 Disorder of male genital organs, unspecified: Secondary | ICD-10-CM | POA: Diagnosis not present

## 2016-12-28 DIAGNOSIS — N50819 Testicular pain, unspecified: Secondary | ICD-10-CM

## 2016-12-28 MED ORDER — DOXYCYCLINE HYCLATE 100 MG PO TABS: 100.0000 mg | ORAL_TABLET | Freq: Two times a day (BID) | ORAL | 0 refills | Status: DC

## 2016-12-28 MED ORDER — CEFTRIAXONE SODIUM 1 G IJ SOLR
1.0000 g | Freq: Once | INTRAMUSCULAR | Status: AC
  Administered 2016-12-28: 1 g via INTRAMUSCULAR

## 2016-12-28 NOTE — Progress Notes (Signed)
   Subjective:    Patient ID: Adrian [name], male    DOB: 11/30/1970, 46 y.o.   MRN: [id number]  HPI He is in today for an acute problem.  His left testicle has been swollen since Wednesday, November 21.  It is tender to touch.  He was due for Remicade treatment the previous day.  His white count was elevated there.  Blood pressures under better control with new regimen.  It is 112/88 today.  He is never had an issue with swollen testicle previously.    Review of Systems No fever or shaking chills    Objective:   Physical Exam  There is an area of about 3 x 4 cm left upper testicle quadrant that is erythematous and warm to touch.  Testicle is swollen in general.      Assessment & Plan:  Cellulitis/orchitis of left testicle  Neurosarcoidosis-being treated with steroids and Remicade  Chronic steroid therapy  Hypertension-no new regimen  Plan: Rocephin 1 g IM.  Doxycycline milligrams twice daily for 10 days.  Call with progress report in 2 days.

## 2016-12-28 NOTE — Addendum Note (Signed)
Addended by: Gregery NaVALENCIA, Jaterrius Ricketson P on: 12/28/2016 12:50 PM   Modules accepted: Orders

## 2016-12-28 NOTE — Patient Instructions (Addendum)
Rocephin 1 g IM.  Doxycycline 100 mg twice daily for 10 days.  Call with progress report in 2 days

## 2016-12-31 ENCOUNTER — Telehealth: Payer: Self-pay | Admitting: Internal Medicine

## 2016-12-31 ENCOUNTER — Encounter: Payer: Self-pay | Admitting: Internal Medicine

## 2016-12-31 DIAGNOSIS — N453 Epididymo-orchitis: Secondary | ICD-10-CM | POA: Diagnosis not present

## 2016-12-31 NOTE — Telephone Encounter (Signed)
Call back saying testicle still swollen. Appt with [facility] obtained.

## 2017-01-03 DIAGNOSIS — D8689 Sarcoidosis of other sites: Secondary | ICD-10-CM | POA: Diagnosis not present

## 2017-01-04 DIAGNOSIS — N453 Epididymo-orchitis: Secondary | ICD-10-CM | POA: Diagnosis not present

## 2017-01-06 DIAGNOSIS — D8689 Sarcoidosis of other sites: Secondary | ICD-10-CM | POA: Diagnosis not present

## 2017-01-11 ENCOUNTER — Other Ambulatory Visit: Payer: Self-pay | Admitting: Internal Medicine

## 2017-01-14 ENCOUNTER — Ambulatory Visit (INDEPENDENT_AMBULATORY_CARE_PROVIDER_SITE_OTHER): Payer: 59 | Admitting: Endocrinology

## 2017-01-14 ENCOUNTER — Encounter: Payer: Self-pay | Admitting: Endocrinology

## 2017-01-14 VITALS — BP 142/80 | HR 99 | Ht 71.5 in | Wt 293.0 lb

## 2017-01-14 DIAGNOSIS — F520 Hypoactive sexual desire disorder: Secondary | ICD-10-CM | POA: Diagnosis not present

## 2017-01-14 DIAGNOSIS — M810 Age-related osteoporosis without current pathological fracture: Secondary | ICD-10-CM

## 2017-01-14 DIAGNOSIS — E291 Testicular hypofunction: Secondary | ICD-10-CM | POA: Diagnosis not present

## 2017-01-14 DIAGNOSIS — R7989 Other specified abnormal findings of blood chemistry: Secondary | ICD-10-CM | POA: Diagnosis not present

## 2017-01-14 MED ORDER — RISEDRONATE SODIUM 150 MG PO TABS: 150.0000 mg | ORAL_TABLET | ORAL | 1 refills | Status: DC
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
## 2017-01-14 NOTE — Progress Notes (Signed)
Thanks so much for help with this complex patient.

## 2017-01-15 LAB — TESTOSTERONE, FREE, TOTAL, SHBG
SEX HORMONE BINDING: 27.4 nmol/L (ref 16.5–55.9)
TESTOSTERONE: 231 ng/dL — AB (ref 264–916)
Testosterone, Free: 3.6 pg/mL — ABNORMAL LOW (ref 6.8–21.5)

## 2017-01-18 ENCOUNTER — Other Ambulatory Visit: Payer: Self-pay

## 2017-01-18 MED ORDER — OLMESARTAN MEDOXOMIL 40 MG PO TABS: 40.0000 mg | ORAL_TABLET | Freq: Every day | ORAL | 2 refills | Status: DC

## 2017-01-21 ENCOUNTER — Other Ambulatory Visit: Payer: Self-pay | Admitting: Endocrinology

## 2017-01-21 DIAGNOSIS — R7989 Other specified abnormal findings of blood chemistry: Secondary | ICD-10-CM

## 2017-01-28 LAB — SPECIMEN STATUS REPORT

## 2017-02-03 LAB — PROLACTIN: Prolactin: 16.2 ng/mL — ABNORMAL HIGH (ref 4.0–15.2)

## 2017-02-03 LAB — SPECIMEN STATUS REPORT

## 2017-02-03 LAB — LUTEINIZING HORMONE: LH: 7.5 m[IU]/mL (ref 1.7–8.6)

## 2017-02-04 ENCOUNTER — Encounter: Payer: Self-pay | Admitting: Endocrinology

## 2017-02-04 ENCOUNTER — Other Ambulatory Visit: Payer: Self-pay | Admitting: Endocrinology

## 2017-02-04 ENCOUNTER — Other Ambulatory Visit: Payer: Self-pay | Admitting: Internal Medicine

## 2017-02-04 MED ORDER — TESTOSTERONE 50 MG/5GM (1%) TD GEL: 5.0000 g | Freq: Every day | TRANSDERMAL | 1 refills | Status: DC

## 2017-02-06 ENCOUNTER — Other Ambulatory Visit: Payer: Self-pay | Admitting: Internal Medicine

## 2017-02-09 ENCOUNTER — Encounter: Payer: Self-pay | Admitting: Internal Medicine

## 2017-02-15 DIAGNOSIS — D869 Sarcoidosis, unspecified: Secondary | ICD-10-CM | POA: Diagnosis not present

## 2017-02-22 ENCOUNTER — Other Ambulatory Visit: Payer: Self-pay | Admitting: Internal Medicine

## 2017-03-05 ENCOUNTER — Other Ambulatory Visit: Payer: Self-pay | Admitting: Neurology

## 2017-03-07 DIAGNOSIS — N390 Urinary tract infection, site not specified: Secondary | ICD-10-CM | POA: Diagnosis not present

## 2017-03-07 DIAGNOSIS — N453 Epididymo-orchitis: Secondary | ICD-10-CM | POA: Diagnosis not present

## 2017-03-07 DIAGNOSIS — B961 Klebsiella pneumoniae [K. pneumoniae] as the cause of diseases classified elsewhere: Secondary | ICD-10-CM | POA: Diagnosis not present

## 2017-03-28 DIAGNOSIS — Z029 Encounter for administrative examinations, unspecified: Secondary | ICD-10-CM

## 2017-04-01 ENCOUNTER — Other Ambulatory Visit: Payer: 59

## 2017-04-01 DIAGNOSIS — R7989 Other specified abnormal findings of blood chemistry: Secondary | ICD-10-CM

## 2017-04-02 LAB — PROLACTIN: Prolactin: 22.1 ng/mL — ABNORMAL HIGH (ref 4.0–15.2)

## 2017-04-02 LAB — LUTEINIZING HORMONE: LH: 8.2 m[IU]/mL (ref 1.7–8.6)

## 2017-04-04 ENCOUNTER — Telehealth: Payer: Self-pay

## 2017-04-04 ENCOUNTER — Ambulatory Visit (INDEPENDENT_AMBULATORY_CARE_PROVIDER_SITE_OTHER): Payer: 59 | Admitting: Endocrinology

## 2017-04-04 ENCOUNTER — Encounter: Payer: Self-pay | Admitting: Endocrinology

## 2017-04-04 VITALS — BP 124/80 | HR 98 | Ht 71.5 in | Wt 315.6 lb

## 2017-04-04 DIAGNOSIS — E230 Hypopituitarism: Secondary | ICD-10-CM | POA: Diagnosis not present

## 2017-04-04 DIAGNOSIS — Z79899 Other long term (current) drug therapy: Secondary | ICD-10-CM | POA: Diagnosis not present

## 2017-04-04 DIAGNOSIS — D8689 Sarcoidosis of other sites: Secondary | ICD-10-CM | POA: Diagnosis not present

## 2017-04-04 NOTE — Progress Notes (Signed)
Let's send him to Dr. Dalbert GarnetBeasley for weight management

## 2017-04-04 NOTE — Telephone Encounter (Signed)
-----   Message from Reather LittlerAjay Kumar, MD sent at 04/04/2017 10:17 AM EST ----- Regarding: P A  testosterone He says that he was told by his drugstore that he will need another prior authorization for his testim.  Please check on this, not clear if this was done
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
## 2017-04-04 NOTE — Telephone Encounter (Signed)
Please advise 

## 2017-04-04 NOTE — Patient Instructions (Signed)
[facility]

## 2017-04-04 NOTE — Telephone Encounter (Signed)
I will call for PA [phone].

## 2017-04-06 NOTE — Telephone Encounter (Signed)
Completed/signed PA form for Testim faxed to [facility] with last 2 OV notes.

## 2017-04-07 ENCOUNTER — Other Ambulatory Visit: Payer: Self-pay | Admitting: Internal Medicine

## 2017-04-07 DIAGNOSIS — N453 Epididymo-orchitis: Secondary | ICD-10-CM | POA: Diagnosis not present

## 2017-04-07 DIAGNOSIS — R3121 Asymptomatic microscopic hematuria: Secondary | ICD-10-CM | POA: Diagnosis not present

## 2017-04-07 DIAGNOSIS — I10 Essential (primary) hypertension: Secondary | ICD-10-CM

## 2017-04-11 ENCOUNTER — Telehealth: Payer: Self-pay

## 2017-04-11 NOTE — Telephone Encounter (Signed)
LVM for patient to call back if he had any questions about testim or if he needed a new prescription

## 2017-04-11 NOTE — Telephone Encounter (Signed)
Pt was approved for testim  has notified notified of this

## 2017-04-12 DIAGNOSIS — D869 Sarcoidosis, unspecified: Secondary | ICD-10-CM | POA: Diagnosis not present

## 2017-04-18 ENCOUNTER — Encounter: Payer: Self-pay | Admitting: Internal Medicine

## 2017-04-18 ENCOUNTER — Telehealth: Payer: Self-pay | Admitting: Internal Medicine

## 2017-04-18 DIAGNOSIS — R0602 Shortness of breath: Secondary | ICD-10-CM

## 2017-04-18 NOTE — Telephone Encounter (Addendum)
SOB and persistent weight gain due to inactivity and leg swelling. 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 Order CXR and make cardiology referral.This info is per Chrisitina Ring, his significant other, who is a Engineer, civil (consulting)nurse.

## 2017-04-20 ENCOUNTER — Other Ambulatory Visit: Payer: Self-pay | Admitting: Internal Medicine

## 2017-04-25 ENCOUNTER — Ambulatory Visit
Admission: RE | Admit: 2017-04-25 | Discharge: 2017-04-25 | Disposition: A | Discharge status: Home / Self Care | Payer: 59 | Source: Ambulatory Visit | Attending: Internal Medicine | Admitting: Internal Medicine

## 2017-04-25 DIAGNOSIS — S2242XD Multiple fractures of ribs, left side, subsequent encounter for fracture with routine healing: Secondary | ICD-10-CM | POA: Diagnosis not present

## 2017-05-04 NOTE — Progress Notes (Signed)
Referral put in to Dr. Dalbert GarnetBeasley on 3/28.  See where Josue HectorLinda McIntosh left [place] on 4/2 and the referral is closed now.  They have only called her 1 time and closed the referral.  ??

## 2017-05-10 ENCOUNTER — Other Ambulatory Visit: Payer: Self-pay | Admitting: Cardiology

## 2017-05-10 ENCOUNTER — Encounter: Payer: Self-pay | Admitting: Cardiology

## 2017-05-10 ENCOUNTER — Ambulatory Visit (INDEPENDENT_AMBULATORY_CARE_PROVIDER_SITE_OTHER): Payer: 59 | Admitting: Cardiology

## 2017-05-10 VITALS — BP 130/70 | HR 81 | Ht 71.5 in | Wt 309.1 lb

## 2017-05-10 DIAGNOSIS — Z7952 Long term (current) use of systemic steroids: Secondary | ICD-10-CM

## 2017-05-10 DIAGNOSIS — R0602 Shortness of breath: Secondary | ICD-10-CM

## 2017-05-10 DIAGNOSIS — I10 Essential (primary) hypertension: Secondary | ICD-10-CM | POA: Diagnosis not present

## 2017-05-10 DIAGNOSIS — D8689 Sarcoidosis of other sites: Secondary | ICD-10-CM
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
## 2017-05-10 NOTE — Patient Instructions (Signed)
Medication Instructions:  Your physician recommends that you continue on your current medications as directed. Please refer to the Current Medication list given to you today.  Labwork: Your physician recommends that you have the following labs drawn: BMP, BNP, CBC, and TSH.  Testing/Procedures: Your physician has requested that you have an echocardiogram. Echocardiography is a painless test that uses sound waves to create images of your heart. It provides your doctor with information about the size and shape of your heart and how well your heart's chambers and valves are working. This procedure takes approximately one hour. There are no restrictions for this procedure.  Your physician has requested that you have a lexiscan myoview. For further information please visit [email or web address]. Please follow instruction sheet, as given.   Follow-Up: Your physician recommends that you schedule a follow-up appointment in: 6 months  Any Other Special Instructions Will Be Listed Below (If Applicable).     If you need a refill on your cardiac medications before your next appointment, please call your pharmacy.   [facility]  Garey HamAshley A, RN, BSN  Echocardiogram An echocardiogram, or echocardiography, uses sound waves (ultrasound) to produce an image of your heart. The echocardiogram is simple, painless, obtained within a short period of time, and offers valuable information to your health care provider. The images from an echocardiogram can provide information such as:  Evidence of coronary artery disease (CAD).  Heart size.  Heart muscle function.  Heart valve function.  Aneurysm detection.  Evidence of a past heart attack.  Fluid buildup around the heart.  Heart muscle thickening.  Assess heart valve function.  Tell a health care provider about:  Any allergies you have.  All medicines you are taking, including vitamins, herbs, eye drops, creams, and over-the-counter  medicines.  Any problems you or family members have had with anesthetic medicines.  Any blood disorders you have.  Any surgeries you have had.  Any medical conditions you have.  Whether you are pregnant or may be pregnant. What happens before the procedure? No special preparation is needed. Eat and drink normally. What happens during the procedure?  In order to produce an image of your heart, gel will be applied to your chest and a wand-like tool (transducer) will be moved over your chest. The gel will help transmit the sound waves from the transducer. The sound waves will harmlessly bounce off your heart to allow the heart images to be captured in real-time motion. These images will then be recorded.  You may need an IV to receive a medicine that improves the quality of the pictures. What happens after the procedure? You may return to your normal schedule including diet, activities, and medicines, unless your health care provider tells you otherwise. This information is not intended to replace advice given to you by your health care provider. Make sure you discuss any questions you have with your health care provider. Document Released: 01/16/2000 Document Revised: 09/06/2015 Document Reviewed: 09/25/2012 Elsevier Interactive Patient Education  2017 [employer]

## 2017-05-11 LAB — CBC WITH DIFFERENTIAL/PLATELET
BASOS: 1 %
Basophils Absolute: 0.1 10*3/uL (ref 0.0–0.2)
EOS (ABSOLUTE): 0.3 10*3/uL (ref 0.0–0.4)
Eos: 4 %
HEMATOCRIT: 45.2 % (ref 37.5–51.0)
HEMOGLOBIN: 15.1 g/dL (ref 13.0–17.7)
IMMATURE GRANS (ABS): 0.1 10*3/uL (ref 0.0–0.1)
Immature Granulocytes: 1 %
Lymphocytes Absolute: 2.6 10*3/uL (ref 0.7–3.1)
Lymphs: 29 %
MCH: 31.9 pg (ref 26.6–33.0)
MCHC: 33.4 g/dL (ref 31.5–35.7)
MCV: 96 fL (ref 79–97)
MONOCYTES: 8 %
Monocytes Absolute: 0.7 10*3/uL (ref 0.1–0.9)
NEUTROS ABS: 5.3 10*3/uL (ref 1.4–7.0)
Neutrophils: 57 %
Platelets: 330 10*3/uL (ref 150–379)
RBC: 4.73 x10E6/uL (ref 4.14–5.80)
RDW: 13 % (ref 12.3–15.4)
WBC: 9.1 10*3/uL (ref 3.4–10.8)

## 2017-05-11 LAB — TSH: TSH: 1.36 u[IU]/mL (ref 0.450–4.500)

## 2017-05-11 LAB — BASIC METABOLIC PANEL
BUN/Creatinine Ratio: 12 (ref 9–20)
BUN: 16 mg/dL (ref 6–24)
CO2: 25 mmol/L (ref 20–29)
Calcium: 9.6 mg/dL (ref 8.7–10.2)
Chloride: 101 mmol/L (ref 96–106)
Creatinine, Ser: 1.29 mg/dL — ABNORMAL HIGH (ref 0.76–1.27)
GFR, EST AFRICAN AMERICAN: 76 mL/min/{1.73_m2} (ref >59)
GFR, EST NON AFRICAN AMERICAN: 66 mL/min/{1.73_m2} (ref >59)
Glucose: 109 mg/dL — ABNORMAL HIGH (ref 65–99)
Potassium: 4.3 mmol/L (ref 3.5–5.2)
Sodium: 142 mmol/L (ref 134–144)

## 2017-05-11 LAB — PRO B NATRIURETIC PEPTIDE: NT-Pro BNP: 12 pg/mL (ref 0–121)

## 2017-05-12 ENCOUNTER — Other Ambulatory Visit: Payer: 59

## 2017-05-14 ENCOUNTER — Other Ambulatory Visit: Payer: Self-pay | Admitting: Endocrinology

## 2017-05-16 ENCOUNTER — Other Ambulatory Visit: Payer: 59

## 2017-05-23 ENCOUNTER — Telehealth (HOSPITAL_COMMUNITY): Payer: Self-pay | Admitting: *Deleted

## 2017-05-23 NOTE — Telephone Encounter (Signed)
Patient given detailed instructions per Myocardial Perfusion Study Information Sheet for the test on 05/25/17 at 1230. Patient notified to arrive 15 minutes early and that it is imperative to arrive on time for appointment to keep from having the test rescheduled.  If you need to cancel or reschedule your appointment, please call the office within 24 hours of your appointment. . Patient verbalized understanding.Vincenza Dail, Adelene IdlerCynthia W

## 2017-05-25 ENCOUNTER — Ambulatory Visit (HOSPITAL_BASED_OUTPATIENT_CLINIC_OR_DEPARTMENT_OTHER): Payer: 59

## 2017-05-25 ENCOUNTER — Telehealth: Payer: Self-pay

## 2017-05-25 ENCOUNTER — Ambulatory Visit (HOSPITAL_COMMUNITY): Payer: 59 | Attending: Cardiovascular Disease

## 2017-05-25 VITALS — BP 126/76

## 2017-05-25 VITALS — Ht 71.5 in | Wt 309.0 lb

## 2017-05-25 DIAGNOSIS — R0602 Shortness of breath: Secondary | ICD-10-CM | POA: Diagnosis not present

## 2017-05-25 DIAGNOSIS — D8689 Sarcoidosis of other sites: Secondary | ICD-10-CM | POA: Diagnosis not present

## 2017-05-25 DIAGNOSIS — R9431 Abnormal electrocardiogram [ECG] [EKG]: Secondary | ICD-10-CM | POA: Insufficient documentation

## 2017-05-25 DIAGNOSIS — R9439 Abnormal result of other cardiovascular function study: Secondary | ICD-10-CM | POA: Insufficient documentation

## 2017-05-25 DIAGNOSIS — I10 Essential (primary) hypertension: Secondary | ICD-10-CM | POA: Insufficient documentation

## 2017-05-25 MED ORDER — PERFLUTREN LIPID MICROSPHERE
1.0000 mL | INTRAVENOUS | Status: AC | PRN
Start: 2017-05-25 — End: 2017-05-25
  Administered 2017-05-25: 2 mL via INTRAVENOUS

## 2017-05-25 MED ORDER — TECHNETIUM TC 99M TETROFOSMIN IV KIT
33.0000 | PACK | Freq: Once | INTRAVENOUS | Status: AC | PRN
  Administered 2017-05-25: 33 via INTRAVENOUS
  Filled 2017-05-25: qty 33

## 2017-05-25 MED ORDER — REGADENOSON 0.4 MG/5ML IV SOLN
0.4000 mg | Freq: Once | INTRAVENOUS | Status: AC
  Administered 2017-05-25: 0.4 mg via INTRAVENOUS

## 2017-05-25 NOTE — Telephone Encounter (Signed)
Left voicemail to discuss echo results. 

## 2017-05-25 NOTE — Telephone Encounter (Signed)
-----   Message from Garwin Brothersajan R Revankar, MD sent at 05/25/2017  2:33 PM EDT ----- The results of the study is unremarkable. Please inform patient. I will discuss in detail at next appointment. Cc  primary care/referring physician Garwin Brothersajan R Revankar, MD 05/25/2017 2:33 PM

## 2017-05-26 ENCOUNTER — Ambulatory Visit (HOSPITAL_COMMUNITY): Payer: 59 | Attending: Cardiology

## 2017-05-26 ENCOUNTER — Telehealth: Payer: Self-pay | Admitting: Internal Medicine

## 2017-05-26 LAB — MYOCARDIAL PERFUSION IMAGING
CHL CUP NUCLEAR SDS: 1
CHL CUP RESTING HR STRESS: 88 {beats}/min
LV dias vol: 111 mL (ref 62–150)
LVSYSVOL: 35 mL
Peak HR: 94 {beats}/min
RATE: 0.43
SRS: 5
SSS: 5
TID: 0.92

## 2017-05-26 MED ORDER — TECHNETIUM TC 99M TETROFOSMIN IV KIT
32.3000 | PACK | Freq: Once | INTRAVENOUS | Status: AC | PRN
  Administered 2017-05-26: 32.3 via INTRAVENOUS
  Filled 2017-05-26: qty 33

## 2017-05-26 NOTE — Telephone Encounter (Signed)
Faxed medical records from 2016-2019 to [facility] [id number] [id number] on 4.25.19

## 2017-05-30 NOTE — Progress Notes (Unsigned)
[id number] 

## 2017-05-31 ENCOUNTER — Telehealth: Payer: Self-pay | Admitting: Endocrinology

## 2017-05-31 ENCOUNTER — Other Ambulatory Visit: Payer: Self-pay | Admitting: Endocrinology

## 2017-05-31 MED ORDER — TESTOSTERONE 50 MG/5GM (1%) TD GEL: TRANSDERMAL | 2 refills | Status: DC

## 2017-06-01 ENCOUNTER — Other Ambulatory Visit: Payer: 59

## 2017-06-01 DIAGNOSIS — E230 Hypopituitarism: Secondary | ICD-10-CM

## 2017-06-03 ENCOUNTER — Ambulatory Visit (INDEPENDENT_AMBULATORY_CARE_PROVIDER_SITE_OTHER): Payer: 59 | Admitting: Endocrinology

## 2017-06-03 ENCOUNTER — Encounter: Payer: Self-pay | Admitting: Endocrinology

## 2017-06-03 VITALS — BP 112/80 | HR 92 | Ht 71.5 in | Wt 321.0 lb

## 2017-06-03 DIAGNOSIS — E230 Hypopituitarism: Secondary | ICD-10-CM | POA: Diagnosis not present

## 2017-06-03 DIAGNOSIS — R5383 Other fatigue: Secondary | ICD-10-CM | POA: Diagnosis not present

## 2017-06-03 LAB — TESTOSTERONE, TOTAL, LC/MS/MS: Testosterone, total: 547.5 ng/dL (ref 264.0–916.0)
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
## 2017-06-03 NOTE — Progress Notes (Signed)
Please send info on Dr. Dalbert Garnet to him

## 2017-06-07 DIAGNOSIS — D869 Sarcoidosis, unspecified: Secondary | ICD-10-CM | POA: Diagnosis not present

## 2017-06-08 DIAGNOSIS — Z0271 Encounter for disability determination: Secondary | ICD-10-CM

## 2017-06-17 ENCOUNTER — Other Ambulatory Visit: Payer: Self-pay | Admitting: Internal Medicine

## 2017-06-30 ENCOUNTER — Encounter (INDEPENDENT_AMBULATORY_CARE_PROVIDER_SITE_OTHER): Payer: 59

## 2017-07-12 ENCOUNTER — Ambulatory Visit (INDEPENDENT_AMBULATORY_CARE_PROVIDER_SITE_OTHER): Payer: 59 | Admitting: Family Medicine

## 2017-07-12 ENCOUNTER — Encounter (INDEPENDENT_AMBULATORY_CARE_PROVIDER_SITE_OTHER): Payer: Self-pay | Admitting: Family Medicine

## 2017-07-12 VITALS — BP 105/76 | HR 67 | Temp 97.5°F | Ht 71.0 in | Wt 304.0 lb

## 2017-07-12 DIAGNOSIS — R0602 Shortness of breath: Secondary | ICD-10-CM | POA: Diagnosis not present

## 2017-07-12 DIAGNOSIS — Z6841 Body Mass Index (BMI) 40.0 and over, adult: Secondary | ICD-10-CM

## 2017-07-12 DIAGNOSIS — Z9189 Other specified personal risk factors, not elsewhere classified: Secondary | ICD-10-CM | POA: Diagnosis not present

## 2017-07-12 DIAGNOSIS — Z0289 Encounter for other administrative examinations: Secondary | ICD-10-CM

## 2017-07-12 DIAGNOSIS — R5383 Other fatigue: Secondary | ICD-10-CM | POA: Diagnosis not present

## 2017-07-12 DIAGNOSIS — Z1331 Encounter for screening for depression: Secondary | ICD-10-CM
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
## 2017-07-13 LAB — COMPREHENSIVE METABOLIC PANEL
A/G RATIO: 1.4 (ref 1.2–2.2)
ALK PHOS: 51 IU/L (ref 39–117)
ALT: 32 IU/L (ref 0–44)
AST: 20 IU/L (ref 0–40)
Albumin: 4.1 g/dL (ref 3.5–5.5)
BUN/Creatinine Ratio: 23 — ABNORMAL HIGH (ref 9–20)
BUN: 25 mg/dL — ABNORMAL HIGH (ref 6–24)
Bilirubin Total: 0.7 mg/dL (ref 0.0–1.2)
CHLORIDE: 98 mmol/L (ref 96–106)
CO2: 26 mmol/L (ref 20–29)
Calcium: 9.1 mg/dL (ref 8.7–10.2)
Creatinine, Ser: 1.09 mg/dL (ref 0.76–1.27)
GFR calc Af Amer: 93 mL/min/{1.73_m2} (ref >59)
GFR calc non Af Amer: 80 mL/min/{1.73_m2} (ref >59)
GLOBULIN, TOTAL: 2.9 g/dL (ref 1.5–4.5)
Glucose: 98 mg/dL (ref 65–99)
POTASSIUM: 4.1 mmol/L (ref 3.5–5.2)
SODIUM: 137 mmol/L (ref 134–144)
Total Protein: 7 g/dL (ref 6.0–8.5)

## 2017-07-13 LAB — LIPID PANEL WITH LDL/HDL RATIO
Cholesterol, Total: 162 mg/dL (ref 100–199)
HDL: 39 mg/dL — ABNORMAL LOW (ref >39)
LDL CALC: 89 mg/dL (ref 0–99)
LDL/HDL RATIO: 2.3 ratio (ref 0.0–3.6)
Triglycerides: 169 mg/dL — ABNORMAL HIGH (ref 0–149)
VLDL Cholesterol Cal: 34 mg/dL (ref 5–40)

## 2017-07-13 LAB — VITAMIN B12: Vitamin B-12: 305 pg/mL (ref 232–1245)

## 2017-07-13 LAB — INSULIN, RANDOM: INSULIN: 31.1 u[IU]/mL — ABNORMAL HIGH (ref 2.6–24.9)

## 2017-07-13 LAB — CBC WITH DIFFERENTIAL
BASOS: 1 %
Basophils Absolute: 0.1 10*3/uL (ref 0.0–0.2)
EOS (ABSOLUTE): 0.3 10*3/uL (ref 0.0–0.4)
EOS: 5 %
HEMATOCRIT: 41.5 % (ref 37.5–51.0)
Hemoglobin: 14.3 g/dL (ref 13.0–17.7)
Immature Grans (Abs): 0 10*3/uL (ref 0.0–0.1)
Immature Granulocytes: 1 %
LYMPHS ABS: 3.1 10*3/uL (ref 0.7–3.1)
Lymphs: 45 %
MCH: 32.6 pg (ref 26.6–33.0)
MCHC: 34.5 g/dL (ref 31.5–35.7)
MCV: 95 fL (ref 79–97)
MONOS ABS: 0.9 10*3/uL (ref 0.1–0.9)
Monocytes: 12 %
Neutrophils Absolute: 2.5 10*3/uL (ref 1.4–7.0)
Neutrophils: 36 %
RBC: 4.39 x10E6/uL (ref 4.14–5.80)
RDW: 12.6 % (ref 12.3–15.4)
WBC: 6.9 10*3/uL (ref 3.4–10.8)

## 2017-07-13 LAB — TSH: TSH: 1.94 u[IU]/mL (ref 0.450–4.500)

## 2017-07-13 LAB — HEMOGLOBIN A1C
Est. average glucose Bld gHb Est-mCnc: 114 mg/dL
HEMOGLOBIN A1C: 5.6 % (ref 4.8–5.6)

## 2017-07-13 LAB — T4, FREE: FREE T4: 1.6 ng/dL (ref 0.82–1.77)

## 2017-07-13 LAB — T3: T3, Total: 120 ng/dL (ref 71–180)

## 2017-07-13 LAB — VITAMIN D 25 HYDROXY (VIT D DEFICIENCY, FRACTURES): Vit D, 25-Hydroxy: 32.6 ng/mL (ref 30.0–100.0)

## 2017-07-13 LAB — FOLATE

## 2017-07-17 ENCOUNTER — Telehealth: Payer: Self-pay | Admitting: Endocrinology

## 2017-07-18 ENCOUNTER — Other Ambulatory Visit: Payer: Self-pay

## 2017-07-18 DIAGNOSIS — Z79899 Other long term (current) drug therapy: Secondary | ICD-10-CM | POA: Diagnosis not present

## 2017-07-18 DIAGNOSIS — D869 Sarcoidosis, unspecified: Secondary | ICD-10-CM | POA: Diagnosis not present

## 2017-07-18 DIAGNOSIS — D8689 Sarcoidosis of other sites: Secondary | ICD-10-CM | POA: Diagnosis not present

## 2017-07-18 MED ORDER — RISEDRONATE SODIUM 150 MG PO TABS: 150.0000 mg | ORAL_TABLET | ORAL | 1 refills | Status: DC

## 2017-07-18 NOTE — Telephone Encounter (Signed)
He will continue with his long-term

## 2017-07-18 NOTE — Telephone Encounter (Signed)
This med has been discontinued. Okay to refill?

## 2017-07-20 DIAGNOSIS — R3121 Asymptomatic microscopic hematuria: Secondary | ICD-10-CM | POA: Diagnosis not present

## 2017-07-20 DIAGNOSIS — N453 Epididymo-orchitis: Secondary | ICD-10-CM | POA: Diagnosis not present

## 2017-07-26 ENCOUNTER — Encounter (INDEPENDENT_AMBULATORY_CARE_PROVIDER_SITE_OTHER): Payer: Self-pay | Admitting: Family Medicine

## 2017-07-26 ENCOUNTER — Ambulatory Visit (INDEPENDENT_AMBULATORY_CARE_PROVIDER_SITE_OTHER): Payer: 59 | Admitting: Family Medicine

## 2017-07-26 VITALS — BP 90/64 | HR 71 | Temp 98.2°F | Ht 71.0 in | Wt 295.0 lb

## 2017-07-26 DIAGNOSIS — E559 Vitamin D deficiency, unspecified: Secondary | ICD-10-CM

## 2017-07-26 DIAGNOSIS — I10 Essential (primary) hypertension: Secondary | ICD-10-CM

## 2017-07-26 DIAGNOSIS — Z6841 Body Mass Index (BMI) 40.0 and over, adult: Secondary | ICD-10-CM

## 2017-07-26 DIAGNOSIS — Z9189 Other specified personal risk factors, not elsewhere classified: Secondary | ICD-10-CM | POA: Diagnosis not present

## 2017-07-26 DIAGNOSIS — E8881 Metabolic syndrome: Secondary | ICD-10-CM

## 2017-07-26 MED ORDER — VITAMIN D (ERGOCALCIFEROL) 1.25 MG (50000 UNIT) PO CAPS: 50000.0000 [IU] | ORAL_CAPSULE | ORAL | 0 refills | Status: DC
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
## 2017-07-29 DIAGNOSIS — D8689 Sarcoidosis of other sites: Secondary | ICD-10-CM | POA: Diagnosis not present

## 2017-07-29 DIAGNOSIS — Z79899 Other long term (current) drug therapy: Secondary | ICD-10-CM | POA: Diagnosis not present

## 2017-07-29 DIAGNOSIS — D869 Sarcoidosis, unspecified: Secondary | ICD-10-CM | POA: Diagnosis not present

## 2017-08-02 DIAGNOSIS — D869 Sarcoidosis, unspecified: Secondary | ICD-10-CM | POA: Diagnosis not present

## 2017-08-12 ENCOUNTER — Other Ambulatory Visit: Payer: Self-pay

## 2017-08-12 DIAGNOSIS — I10 Essential (primary) hypertension: Secondary | ICD-10-CM

## 2017-08-12 MED ORDER — CARVEDILOL 6.25 MG PO TABS: ORAL_TABLET | ORAL | 3 refills | Status: DC

## 2017-08-12 MED ORDER — AMLODIPINE BESYLATE 5 MG PO TABS: 5.0000 mg | ORAL_TABLET | Freq: Every day | ORAL | 1 refills | Status: DC

## 2017-08-15 ENCOUNTER — Ambulatory Visit (INDEPENDENT_AMBULATORY_CARE_PROVIDER_SITE_OTHER): Payer: 59 | Admitting: Family Medicine

## 2017-08-15 ENCOUNTER — Other Ambulatory Visit: Payer: Self-pay | Admitting: Internal Medicine

## 2017-08-15 VITALS — BP 97/67 | HR 67 | Temp 98.3°F | Ht 71.0 in | Wt 289.0 lb

## 2017-08-15 DIAGNOSIS — Z9189 Other specified personal risk factors, not elsewhere classified: Secondary | ICD-10-CM | POA: Diagnosis not present

## 2017-08-15 DIAGNOSIS — I10 Essential (primary) hypertension: Secondary | ICD-10-CM | POA: Diagnosis not present

## 2017-08-15 DIAGNOSIS — E559 Vitamin D deficiency, unspecified: Secondary | ICD-10-CM | POA: Diagnosis not present

## 2017-08-15 DIAGNOSIS — E66813 Obesity, class 3: Secondary | ICD-10-CM

## 2017-08-15 DIAGNOSIS — Z6841 Body Mass Index (BMI) 40.0 and over, adult: Secondary | ICD-10-CM
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
## 2017-08-29 ENCOUNTER — Encounter (INDEPENDENT_AMBULATORY_CARE_PROVIDER_SITE_OTHER): Payer: Self-pay | Admitting: Family Medicine

## 2017-08-29 ENCOUNTER — Ambulatory Visit (INDEPENDENT_AMBULATORY_CARE_PROVIDER_SITE_OTHER): Payer: 59 | Admitting: Family Medicine

## 2017-08-29 VITALS — BP 89/58 | HR 67 | Temp 97.9°F | Ht 71.0 in | Wt 281.0 lb

## 2017-08-29 DIAGNOSIS — E559 Vitamin D deficiency, unspecified: Secondary | ICD-10-CM

## 2017-08-29 DIAGNOSIS — Z9189 Other specified personal risk factors, not elsewhere classified: Secondary | ICD-10-CM | POA: Diagnosis not present

## 2017-08-29 DIAGNOSIS — Z6839 Body mass index (BMI) 39.0-39.9, adult: Secondary | ICD-10-CM

## 2017-08-29 DIAGNOSIS — I10 Essential (primary) hypertension: Secondary | ICD-10-CM

## 2017-08-29 MED ORDER — VITAMIN D (ERGOCALCIFEROL) 1.25 MG (50000 UNIT) PO CAPS: 50000.0000 [IU] | ORAL_CAPSULE | ORAL | 0 refills | Status: DC
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
## 2017-08-31 ENCOUNTER — Encounter: Payer: Self-pay | Admitting: Internal Medicine

## 2017-08-31 DIAGNOSIS — Z0000 Encounter for general adult medical examination without abnormal findings: Secondary | ICD-10-CM

## 2017-08-31 DIAGNOSIS — E781 Pure hyperglyceridemia: Secondary | ICD-10-CM

## 2017-08-31 DIAGNOSIS — D8689 Sarcoidosis of other sites: Secondary | ICD-10-CM

## 2017-08-31 DIAGNOSIS — I10 Essential (primary) hypertension: Secondary | ICD-10-CM

## 2017-09-02 ENCOUNTER — Other Ambulatory Visit: Payer: 59 | Admitting: Internal Medicine

## 2017-09-02 DIAGNOSIS — D8689 Sarcoidosis of other sites: Secondary | ICD-10-CM | POA: Diagnosis not present

## 2017-09-02 DIAGNOSIS — I10 Essential (primary) hypertension: Secondary | ICD-10-CM | POA: Diagnosis not present

## 2017-09-02 DIAGNOSIS — Z0000 Encounter for general adult medical examination without abnormal findings: Secondary | ICD-10-CM

## 2017-09-02 DIAGNOSIS — E781 Pure hyperglyceridemia: Secondary | ICD-10-CM

## 2017-09-02 LAB — CBC WITH DIFFERENTIAL/PLATELET
BASOS PCT: 0.9 %
Basophils Absolute: 58 cells/uL (ref 0–200)
EOS ABS: 218 {cells}/uL (ref 15–500)
Eosinophils Relative: 3.4 %
HCT: 36.8 % — ABNORMAL LOW (ref 38.5–50.0)
HEMOGLOBIN: 12.7 g/dL — AB (ref 13.2–17.1)
LYMPHS ABS: 3469 {cells}/uL (ref 850–3900)
MCH: 31.9 pg (ref 27.0–33.0)
MCHC: 34.5 g/dL (ref 32.0–36.0)
MCV: 92.5 fL (ref 80.0–100.0)
MPV: 11.5 fL (ref 7.5–12.5)
Monocytes Relative: 12.9 %
NEUTROS ABS: 1830 {cells}/uL (ref 1500–7800)
Neutrophils Relative %: 28.6 %
Platelets: 280 10*3/uL (ref 140–400)
RBC: 3.98 10*6/uL — ABNORMAL LOW (ref 4.20–5.80)
RDW: 11.6 % (ref 11.0–15.0)
Total Lymphocyte: 54.2 %
WBC: 6.4 10*3/uL (ref 3.8–10.8)
WBCMIX: 826 {cells}/uL (ref 200–950)

## 2017-09-02 LAB — COMPLETE METABOLIC PANEL WITH GFR
AG Ratio: 1.4 (calc) (ref 1.0–2.5)
ALT: 18 U/L (ref 9–46)
AST: 15 U/L (ref 10–40)
Albumin: 3.7 g/dL (ref 3.6–5.1)
Alkaline phosphatase (APISO): 42 U/L (ref 40–115)
BUN/Creatinine Ratio: 22 (calc) (ref 6–22)
BUN: 31 mg/dL — AB (ref 7–25)
CALCIUM: 9 mg/dL (ref 8.6–10.3)
CO2: 26 mmol/L (ref 20–32)
CREATININE: 1.41 mg/dL — AB (ref 0.60–1.35)
Chloride: 105 mmol/L (ref 98–110)
GFR, EST NON AFRICAN AMERICAN: 59 mL/min/{1.73_m2} — AB (ref >=60)
GFR, Est African American: 68 mL/min/{1.73_m2} (ref >=60)
GLOBULIN: 2.7 g/dL (ref 1.9–3.7)
GLUCOSE: 95 mg/dL (ref 65–99)
Potassium: 4.3 mmol/L (ref 3.5–5.3)
Sodium: 138 mmol/L (ref 135–146)
Total Bilirubin: 0.5 mg/dL (ref 0.2–1.2)
Total Protein: 6.4 g/dL (ref 6.1–8.1)

## 2017-09-02 LAB — LIPID PANEL
Cholesterol: 148 mg/dL (ref <200)
HDL: 35 mg/dL — ABNORMAL LOW (ref >40)
LDL CHOLESTEROL (CALC): 90 mg/dL
NON-HDL CHOLESTEROL (CALC): 113 mg/dL (ref <130)
TRIGLYCERIDES: 131 mg/dL (ref <150)
Total CHOL/HDL Ratio: 4.2 (calc) (ref <5.0)

## 2017-09-05 ENCOUNTER — Encounter: Payer: Self-pay | Admitting: Internal Medicine

## 2017-09-05 ENCOUNTER — Ambulatory Visit (INDEPENDENT_AMBULATORY_CARE_PROVIDER_SITE_OTHER): Payer: 59 | Admitting: Internal Medicine

## 2017-09-05 VITALS — BP 120/68 | HR 90 | Ht 71.0 in | Wt 295.0 lb

## 2017-09-05 DIAGNOSIS — I10 Essential (primary) hypertension: Secondary | ICD-10-CM | POA: Diagnosis not present

## 2017-09-05 DIAGNOSIS — E786 Lipoprotein deficiency: Secondary | ICD-10-CM

## 2017-09-05 DIAGNOSIS — D849 Immunodeficiency, unspecified: Secondary | ICD-10-CM | POA: Diagnosis not present

## 2017-09-05 DIAGNOSIS — H4922 Sixth [abducent] nerve palsy, left eye: Secondary | ICD-10-CM

## 2017-09-05 DIAGNOSIS — M8589 Other specified disorders of bone density and structure, multiple sites: Secondary | ICD-10-CM | POA: Diagnosis not present

## 2017-09-05 DIAGNOSIS — D8689 Sarcoidosis of other sites: Secondary | ICD-10-CM

## 2017-09-05 DIAGNOSIS — D649 Anemia, unspecified: Secondary | ICD-10-CM

## 2017-09-05 DIAGNOSIS — R7989 Other specified abnormal findings of blood chemistry: Secondary | ICD-10-CM

## 2017-09-05 DIAGNOSIS — D899 Disorder involving the immune mechanism, unspecified: Secondary | ICD-10-CM

## 2017-09-05 DIAGNOSIS — Z0000 Encounter for general adult medical examination without abnormal findings: Secondary | ICD-10-CM | POA: Diagnosis not present

## 2017-09-05 LAB — POCT URINALYSIS DIPSTICK
Appearance: NORMAL
Bilirubin, UA: NEGATIVE
Blood, UA: NEGATIVE
GLUCOSE UA: NEGATIVE
KETONES UA: NEGATIVE
Leukocytes, UA: NEGATIVE
Nitrite, UA: NEGATIVE
Odor: NORMAL
Protein, UA: NEGATIVE
SPEC GRAV UA: 1.01 (ref 1.010–1.025)
Urobilinogen, UA: 0.2 E.U./dL
pH, UA: 6.5 (ref 5.0–8.0)

## 2017-09-05 LAB — CBC WITH DIFFERENTIAL/PLATELET
BASOS ABS: 80 {cells}/uL (ref 0–200)
Basophils Relative: 1.2 %
EOS ABS: 147 {cells}/uL (ref 15–500)
Eosinophils Relative: 2.2 %
HEMATOCRIT: 40.2 % (ref 38.5–50.0)
Hemoglobin: 13.7 g/dL (ref 13.2–17.1)
LYMPHS ABS: 2238 {cells}/uL (ref 850–3900)
MCH: 31.6 pg (ref 27.0–33.0)
MCHC: 34.1 g/dL (ref 32.0–36.0)
MCV: 92.6 fL (ref 80.0–100.0)
MPV: 11.7 fL (ref 7.5–12.5)
Monocytes Relative: 7.9 %
NEUTROS PCT: 55.3 %
Neutro Abs: 3705 cells/uL (ref 1500–7800)
Platelets: 312 10*3/uL (ref 140–400)
RBC: 4.34 10*6/uL (ref 4.20–5.80)
RDW: 11.5 % (ref 11.0–15.0)
Total Lymphocyte: 33.4 %
WBC: 6.7 10*3/uL (ref 3.8–10.8)
WBCMIX: 529 {cells}/uL (ref 200–950)

## 2017-09-05 LAB — BASIC METABOLIC PANEL
BUN / CREAT RATIO: 16 (calc) (ref 6–22)
BUN: 22 mg/dL (ref 7–25)
CO2: 28 mmol/L (ref 20–32)
CREATININE: 1.38 mg/dL — AB (ref 0.60–1.35)
Calcium: 9.4 mg/dL (ref 8.6–10.3)
Chloride: 103 mmol/L (ref 98–110)
GLUCOSE: 128 mg/dL — AB (ref 65–99)
Potassium: 4.2 mmol/L (ref 3.5–5.3)
Sodium: 139 mmol/L (ref 135–146)

## 2017-09-05 LAB — IRON,TIBC AND FERRITIN PANEL
%SAT: 32 % (ref 20–48)
Ferritin: 198 ng/mL (ref 38–380)
Iron: 95 ug/dL (ref 50–180)
TIBC: 297 mcg/dL (calc) (ref 250–425)

## 2017-09-05 LAB — FOLATE: FOLATE: 9.4 ng/mL

## 2017-09-05 LAB — VITAMIN B12: Vitamin B-12: 344 pg/mL (ref 200–1100)

## 2017-09-05 MED ORDER — OLMESARTAN MEDOXOMIL 20 MG PO TABS: 20.0000 mg | ORAL_TABLET | Freq: Every day | ORAL | 1 refills | Status: DC

## 2017-09-05 NOTE — Progress Notes (Signed)
Subjective:    Patient ID: Adrian [name], male    DOB: 12/01/1970, 47 y.o.   MRN: [id number]  HPI 47 year old Male for health maintenance exam and evaluation of medical issues. Hx Neurosarcoidosis treated at [facility]. Has been on Remicade 100 mg every other month IV at [facility]. Down to prednisone 5 mg daily. Prednisone is being tapered at [facility].   Hx  HTN with BP today 120/68.  No known drug allergies  Has elevated serum creatinine fasting. Is on ARB- need to repeat this today. CBC is low at 12.7 from 14.3 Draw anemia studies.  Hx low HDL but other lipid values are normal. Has lost weight under guidance of [facility].  Balance still abnormal. Stumbles but no recent falls. Still sees double. Wears eye patch left eye. Needs to see Opthalmologist [facility].  Family History: He has 1 son and 1 daughter.  Father died at age 48 due to sepsis and AML complications.  Mother living with history of ovarian cancer.  One brother age 48 with obesity, diabetes mellitus and history of MI.  One sister age 48 overweight.  Social history: He is divorced.  He was living with a girlfriend who is a Therapist, musicHospice nurse.  She took very good care of him during his acute illness and thereafter but it was a very difficult situation with his illness and their relationship deteriorated.   He has moved back in with his mother.  His girlfriend is still supportive.  He currently is unable to work.  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  He does not smoke.  Does not consume beer.  He fractured his left fourth and fifth fingers in 2014 due to sports injury.  Left inguinal hernia repair in 1998.  Remote history of periorbital fracture treated by Dr. Adrian BlackwaterStinson.  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  Initially hospitalized June 2018.  MRI showed  multiple lesions that were contrast-enhancing scattered throughout the brain.  Lumbar puncture was performed.  ACE level was 55.  ANA was negative.  TSH was normal and sed rate was 2.  He was placed on high-dose steroids and gained a lot of weight.  Subsequently neurologist here placed him on Imuran before he began treatment at [facility] with Remicade.  Still has issues with balance.  He had numerous infections while on prednisone including scrotal cellulitis that took a long time to resolve.    Review of Systems see above     Objective:   Physical Exam  Constitutional: He is oriented to person, place, and time. He appears well-developed and well-nourished.  HENT:  Head: Normocephalic.  Right Ear: External ear normal.  Left Ear: External ear normal.  Mouth/Throat: Oropharynx is clear and moist. No oropharyngeal exudate.  Eyes: Conjunctivae are normal. No scleral icterus.  Left VI nerve palsy  Neck: No JVD present. No thyromegaly present.  Cardiovascular: Normal rate, regular rhythm and normal heart sounds.  No murmur heard. Pulmonary/Chest: No stridor. No respiratory distress. He has no wheezes. He has no rales.  Abdominal: Soft. Bowel sounds are normal. He exhibits no distension and no mass. There is no tenderness. There is no rebound and no guarding.  Genitourinary:  Genitourinary Comments: No testicular masses  Lymphadenopathy:    He has no cervical adenopathy.  Neurological: He  is alert and oriented to person, place, and time.  Skin: Skin is warm and dry.  Psychiatric: He has a normal mood and affect. His behavior is normal. Judgment normal.          Assessment & Plan:  Neurosarcoidosis being treated with Remicade at [facility]  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 Dalbert Garnet for obesity and is doing well  Hypertension-stable on current regimen  Osteopenia- secondary to prednisone therapy currently being treated with Actonel.  Bone density study  done October 2018 with T score of -2.1  Elevated serum creatinine 1.41 repeated and was 1.38.  Olmesartan will be discontinued and he will follow-up late August.  Mild anemia-continue to monitor.  Iron level is 95 and ferritin is 198.  B12 and folate levels are normal.  Continue multivitamin with iron.  Low HDL cholesterol  Plan: Follow-up late August with regard to elevated creatinine.  Plan: Continue current regimen and return in 6 months.

## 2017-09-07 DIAGNOSIS — D8689 Sarcoidosis of other sites: Secondary | ICD-10-CM | POA: Diagnosis not present

## 2017-09-08 ENCOUNTER — Telehealth: Payer: Self-pay

## 2017-09-08 NOTE — Telephone Encounter (Signed)
Received fax from [facility] stating that the pt's PA for Testim Gel 1% (50mg ) is set to expire soon.

## 2017-09-12 DIAGNOSIS — H50012 Monocular esotropia, left eye: Secondary | ICD-10-CM | POA: Diagnosis not present

## 2017-09-12 DIAGNOSIS — H519 Unspecified disorder of binocular movement: Secondary | ICD-10-CM | POA: Diagnosis not present

## 2017-09-12 DIAGNOSIS — H4922 Sixth [abducent] nerve palsy, left eye: Secondary | ICD-10-CM | POA: Diagnosis not present

## 2017-09-13 ENCOUNTER — Ambulatory Visit (INDEPENDENT_AMBULATORY_CARE_PROVIDER_SITE_OTHER): Payer: 59 | Admitting: Family Medicine

## 2017-09-15 ENCOUNTER — Ambulatory Visit (INDEPENDENT_AMBULATORY_CARE_PROVIDER_SITE_OTHER): Payer: 59 | Admitting: Family Medicine

## 2017-09-15 VITALS — BP 101/66 | HR 75 | Temp 98.1°F | Ht 71.0 in | Wt 283.0 lb

## 2017-09-15 DIAGNOSIS — I10 Essential (primary) hypertension: Secondary | ICD-10-CM

## 2017-09-15 DIAGNOSIS — Z6839 Body mass index (BMI) 39.0-39.9, adult: Secondary | ICD-10-CM | POA: Diagnosis not present

## 2017-09-15 DIAGNOSIS — E66812 Obesity, class 2: Secondary | ICD-10-CM
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
## 2017-09-21 ENCOUNTER — Other Ambulatory Visit: Payer: Self-pay | Admitting: Endocrinology

## 2017-09-27 ENCOUNTER — Other Ambulatory Visit: Payer: Self-pay | Admitting: Internal Medicine

## 2017-09-27 DIAGNOSIS — D869 Sarcoidosis, unspecified: Secondary | ICD-10-CM | POA: Diagnosis not present

## 2017-09-27 DIAGNOSIS — R7989 Other specified abnormal findings of blood chemistry: Secondary | ICD-10-CM

## 2017-09-29 ENCOUNTER — Ambulatory Visit (INDEPENDENT_AMBULATORY_CARE_PROVIDER_SITE_OTHER): Payer: 59 | Admitting: Family Medicine

## 2017-09-29 ENCOUNTER — Other Ambulatory Visit: Payer: 59 | Admitting: Internal Medicine

## 2017-09-29 VITALS — BP 113/72 | HR 64 | Temp 98.4°F | Ht 71.0 in | Wt 288.0 lb

## 2017-09-29 DIAGNOSIS — E559 Vitamin D deficiency, unspecified: Secondary | ICD-10-CM

## 2017-09-29 DIAGNOSIS — Z9189 Other specified personal risk factors, not elsewhere classified: Secondary | ICD-10-CM

## 2017-09-29 DIAGNOSIS — Z6841 Body Mass Index (BMI) 40.0 and over, adult: Secondary | ICD-10-CM

## 2017-09-29 DIAGNOSIS — I10 Essential (primary) hypertension: Secondary | ICD-10-CM | POA: Diagnosis not present

## 2017-09-29 DIAGNOSIS — R7989 Other specified abnormal findings of blood chemistry: Secondary | ICD-10-CM | POA: Diagnosis not present

## 2017-09-29 MED ORDER — VITAMIN D (ERGOCALCIFEROL) 1.25 MG (50000 UNIT) PO CAPS: 50000.0000 [IU] | ORAL_CAPSULE | ORAL | 0 refills | Status: DC

## 2017-09-29 NOTE — Patient Instructions (Addendum)
It was a pleasure to see you today.  Creatinine is elevated.  Start Benicar.  Follow-up late August.

## 2017-09-30 ENCOUNTER — Encounter: Payer: Self-pay | Admitting: Internal Medicine

## 2017-09-30 ENCOUNTER — Other Ambulatory Visit (INDEPENDENT_AMBULATORY_CARE_PROVIDER_SITE_OTHER): Payer: 59

## 2017-09-30 ENCOUNTER — Ambulatory Visit (INDEPENDENT_AMBULATORY_CARE_PROVIDER_SITE_OTHER): Payer: 59 | Admitting: Internal Medicine

## 2017-09-30 VITALS — BP 120/80 | HR 92 | Temp 98.3°F | Ht 71.0 in | Wt 288.0 lb

## 2017-09-30 DIAGNOSIS — E230 Hypopituitarism: Secondary | ICD-10-CM

## 2017-09-30 DIAGNOSIS — R7989 Other specified abnormal findings of blood chemistry: Secondary | ICD-10-CM

## 2017-09-30 DIAGNOSIS — R5383 Other fatigue: Secondary | ICD-10-CM | POA: Diagnosis not present

## 2017-09-30 DIAGNOSIS — I10 Essential (primary) hypertension: Secondary | ICD-10-CM

## 2017-09-30 DIAGNOSIS — D8689 Sarcoidosis of other sites: Secondary | ICD-10-CM

## 2017-09-30 LAB — T4, FREE: Free T4: 0.88 ng/dL (ref 0.60–1.60)

## 2017-09-30 LAB — BASIC METABOLIC PANEL
BUN: 18 mg/dL (ref 7–25)
CO2: 28 mmol/L (ref 20–32)
Calcium: 9.1 mg/dL (ref 8.6–10.3)
Chloride: 106 mmol/L (ref 98–110)
Creat: 1.22 mg/dL (ref 0.60–1.35)
GLUCOSE: 117 mg/dL — AB (ref 65–99)
Potassium: 3.7 mmol/L (ref 3.5–5.3)
Sodium: 141 mmol/L (ref 135–146)

## 2017-09-30 LAB — TESTOSTERONE: TESTOSTERONE: 286.94 ng/dL — AB (ref 300.00–890.00)

## 2017-09-30 NOTE — Progress Notes (Signed)
   Subjective:    Patient ID: Adrian [name], male    DOB: 10/22/1970, 47 y.o.   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 Creatinine elevated on Benicar so it was discontinued and he is here for follow up.  He is now seeing Dr. Dalbert [name] for obesity management.  He is no longer on high-dose steroids.  He has a stable blood pressure just with amlodipine and Lasix alone.  He is down to prednisone 5 mg daily.    Review of Systems     Objective:   Physical Exam  Spent 15 minutes speaking with him about these issues and he was given copy of lab work for review.  Creatinine is now within normal limits at 1.22. On August 2 it was 1.41 and was repeated on August 5 and was 1.38 at which time Benicar was discontinued.     Assessment & Plan:  Elevated creatinine on Benicar-this is been discontinued.  Continue Lasix and amlodipine  Plan: He will return to the office February 2020 for 6994-month recheck.
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
## 2017-09-30 NOTE — Patient Instructions (Signed)
Remain off of Benicar.  Continue Lasix and amlodipine and follow-up in February.  Continue to work on diet exercise and weight loss.

## 2017-10-01 LAB — PROLACTIN: PROLACTIN: 14.4 ng/mL (ref 4.0–15.2)

## 2017-10-05 ENCOUNTER — Encounter: Payer: Self-pay | Admitting: Endocrinology

## 2017-10-05 ENCOUNTER — Ambulatory Visit (INDEPENDENT_AMBULATORY_CARE_PROVIDER_SITE_OTHER): Payer: 59 | Admitting: Endocrinology

## 2017-10-05 VITALS — BP 124/86 | HR 82 | Ht 71.0 in | Wt 294.2 lb

## 2017-10-05 DIAGNOSIS — E230 Hypopituitarism: Secondary | ICD-10-CM

## 2017-10-05 DIAGNOSIS — M810 Age-related osteoporosis without current pathological fracture: Secondary | ICD-10-CM | POA: Diagnosis not present

## 2017-10-05 MED ORDER — TESTIM 50 MG/5GM (1%) TD GEL: 5.0000 g | Freq: Every day | TRANSDERMAL | 3 refills | Status: DC

## 2017-10-05 MED ORDER — RISEDRONATE SODIUM 150 MG PO TABS: 150.0000 mg | ORAL_TABLET | ORAL | 1 refills | Status: DC
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
## 2017-10-11 DIAGNOSIS — H50012 Monocular esotropia, left eye: Secondary | ICD-10-CM | POA: Diagnosis not present

## 2017-10-11 DIAGNOSIS — H519 Unspecified disorder of binocular movement: Secondary | ICD-10-CM | POA: Diagnosis not present

## 2017-10-11 DIAGNOSIS — H4922 Sixth [abducent] nerve palsy, left eye: Secondary | ICD-10-CM | POA: Diagnosis not present

## 2017-10-11 DIAGNOSIS — D8682 Multiple cranial nerve palsies in sarcoidosis: Secondary | ICD-10-CM | POA: Diagnosis not present

## 2017-10-11 DIAGNOSIS — S0232XS Fracture of orbital floor, left side, sequela: Secondary | ICD-10-CM | POA: Diagnosis not present

## 2017-10-17 ENCOUNTER — Ambulatory Visit (INDEPENDENT_AMBULATORY_CARE_PROVIDER_SITE_OTHER): Payer: 59 | Admitting: Family Medicine

## 2017-10-17 VITALS — BP 120/80 | HR 83 | Temp 98.0°F | Ht 71.0 in | Wt 281.0 lb

## 2017-10-17 DIAGNOSIS — I10 Essential (primary) hypertension: Secondary | ICD-10-CM | POA: Diagnosis not present

## 2017-10-17 DIAGNOSIS — E8881 Metabolic syndrome: Secondary | ICD-10-CM

## 2017-10-17 DIAGNOSIS — E559 Vitamin D deficiency, unspecified: Secondary | ICD-10-CM

## 2017-10-17 DIAGNOSIS — Z9189 Other specified personal risk factors, not elsewhere classified: Secondary | ICD-10-CM | POA: Diagnosis not present

## 2017-10-17 DIAGNOSIS — Z6839 Body mass index (BMI) 39.0-39.9, adult: Secondary | ICD-10-CM

## 2017-10-17 MED ORDER — VITAMIN D (ERGOCALCIFEROL) 1.25 MG (50000 UNIT) PO CAPS: 50000.0000 [IU] | ORAL_CAPSULE | ORAL | 0 refills | Status: DC

## 2017-10-19 DIAGNOSIS — R29891 Ocular torticollis: Secondary | ICD-10-CM | POA: Insufficient documentation
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
## 2017-10-21 ENCOUNTER — Other Ambulatory Visit: Payer: Self-pay | Admitting: Internal Medicine

## 2017-10-31 ENCOUNTER — Ambulatory Visit (INDEPENDENT_AMBULATORY_CARE_PROVIDER_SITE_OTHER): Payer: 59 | Admitting: Family Medicine

## 2017-10-31 VITALS — BP 115/79 | HR 62 | Temp 98.2°F | Ht 71.0 in | Wt 284.0 lb

## 2017-10-31 DIAGNOSIS — R7989 Other specified abnormal findings of blood chemistry: Secondary | ICD-10-CM

## 2017-10-31 DIAGNOSIS — Z6839 Body mass index (BMI) 39.0-39.9, adult: Secondary | ICD-10-CM

## 2017-10-31 DIAGNOSIS — E559 Vitamin D deficiency, unspecified: Secondary | ICD-10-CM | POA: Diagnosis not present

## 2017-10-31 DIAGNOSIS — E8881 Metabolic syndrome: Secondary | ICD-10-CM

## 2017-10-31 DIAGNOSIS — Z9189 Other specified personal risk factors, not elsewhere classified: Secondary | ICD-10-CM | POA: Diagnosis not present
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
## 2017-11-01 LAB — COMPREHENSIVE METABOLIC PANEL
ALT: 47 IU/L — AB (ref 0–44)
AST: 44 IU/L — ABNORMAL HIGH (ref 0–40)
Albumin/Globulin Ratio: 1.3 (ref 1.2–2.2)
Albumin: 3.9 g/dL (ref 3.5–5.5)
Alkaline Phosphatase: 45 IU/L (ref 39–117)
BUN/Creatinine Ratio: 13 (ref 9–20)
BUN: 16 mg/dL (ref 6–24)
Bilirubin Total: 0.6 mg/dL (ref 0.0–1.2)
CALCIUM: 9.2 mg/dL (ref 8.7–10.2)
CO2: 25 mmol/L (ref 20–29)
CREATININE: 1.19 mg/dL (ref 0.76–1.27)
Chloride: 99 mmol/L (ref 96–106)
GFR, EST AFRICAN AMERICAN: 83 mL/min/{1.73_m2} (ref >59)
GFR, EST NON AFRICAN AMERICAN: 72 mL/min/{1.73_m2} (ref >59)
Globulin, Total: 3 g/dL (ref 1.5–4.5)
Glucose: 96 mg/dL (ref 65–99)
POTASSIUM: 3.9 mmol/L (ref 3.5–5.2)
Sodium: 139 mmol/L (ref 134–144)
TOTAL PROTEIN: 6.9 g/dL (ref 6.0–8.5)

## 2017-11-01 LAB — INSULIN, RANDOM: INSULIN: 24.8 u[IU]/mL (ref 2.6–24.9)

## 2017-11-01 LAB — VITAMIN D 25 HYDROXY (VIT D DEFICIENCY, FRACTURES): Vit D, 25-Hydroxy: 49.9 ng/mL (ref 30.0–100.0)

## 2017-11-01 LAB — HEMOGLOBIN A1C
Est. average glucose Bld gHb Est-mCnc: 97 mg/dL
Hgb A1c MFr Bld: 5 % (ref 4.8–5.6)

## 2017-11-02 DIAGNOSIS — D8689 Sarcoidosis of other sites: Secondary | ICD-10-CM | POA: Diagnosis not present

## 2017-11-08 ENCOUNTER — Telehealth: Payer: Self-pay

## 2017-11-08 NOTE — Telephone Encounter (Signed)
[employer] rx approval for PA for testim gel 50 mg PA #[id number] valid through 11/09/2018

## 2017-11-10 ENCOUNTER — Ambulatory Visit (INDEPENDENT_AMBULATORY_CARE_PROVIDER_SITE_OTHER): Payer: 59 | Admitting: Physician Assistant

## 2017-11-10 ENCOUNTER — Encounter (INDEPENDENT_AMBULATORY_CARE_PROVIDER_SITE_OTHER): Payer: Self-pay | Admitting: Physician Assistant

## 2017-11-10 VITALS — BP 117/77 | Temp 97.9°F | Ht 71.0 in | Wt 283.0 lb

## 2017-11-10 DIAGNOSIS — Z6839 Body mass index (BMI) 39.0-39.9, adult: Secondary | ICD-10-CM | POA: Diagnosis not present

## 2017-11-10 DIAGNOSIS — E559 Vitamin D deficiency, unspecified: Secondary | ICD-10-CM | POA: Diagnosis not present
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
## 2017-11-16 ENCOUNTER — Telehealth: Payer: Self-pay | Admitting: Endocrinology

## 2017-11-16 NOTE — Telephone Encounter (Signed)
Testim Gel 50mg  approved 11/09/2018

## 2017-11-18 ENCOUNTER — Other Ambulatory Visit (INDEPENDENT_AMBULATORY_CARE_PROVIDER_SITE_OTHER): Payer: Self-pay | Admitting: Family Medicine

## 2017-11-18 DIAGNOSIS — E559 Vitamin D deficiency, unspecified: Secondary | ICD-10-CM

## 2017-11-20 ENCOUNTER — Emergency Department (HOSPITAL_COMMUNITY): Payer: 59

## 2017-11-20 ENCOUNTER — Encounter (HOSPITAL_COMMUNITY): Payer: Self-pay

## 2017-11-20 ENCOUNTER — Emergency Department (HOSPITAL_COMMUNITY)
Admission: EM | Admit: 2017-11-20 | Discharge: 2017-11-20 | Disposition: A | Discharge status: Home / Self Care | Payer: 59 | Attending: Emergency Medicine | Admitting: Emergency Medicine

## 2017-11-20 ENCOUNTER — Other Ambulatory Visit: Payer: Self-pay

## 2017-11-20 DIAGNOSIS — M79601 Pain in right arm: Secondary | ICD-10-CM | POA: Diagnosis not present

## 2017-11-20 DIAGNOSIS — I10 Essential (primary) hypertension: Secondary | ICD-10-CM | POA: Insufficient documentation

## 2017-11-20 DIAGNOSIS — M7989 Other specified soft tissue disorders: Secondary | ICD-10-CM

## 2017-11-20 DIAGNOSIS — M79602 Pain in left arm: Secondary | ICD-10-CM | POA: Diagnosis not present

## 2017-11-20 DIAGNOSIS — F17220 Nicotine dependence, chewing tobacco, uncomplicated: Secondary | ICD-10-CM | POA: Insufficient documentation

## 2017-11-20 DIAGNOSIS — R600 Localized edema: Secondary | ICD-10-CM | POA: Diagnosis not present

## 2017-11-20 DIAGNOSIS — Z79899 Other long term (current) drug therapy: Secondary | ICD-10-CM | POA: Diagnosis not present

## 2017-11-20 DIAGNOSIS — R2232 Localized swelling, mass and lump, left upper limb: Secondary | ICD-10-CM | POA: Diagnosis not present

## 2017-11-20 LAB — BASIC METABOLIC PANEL
Anion gap: 8 (ref 5–15)
BUN: 13 mg/dL (ref 6–20)
CHLORIDE: 105 mmol/L (ref 98–111)
CO2: 24 mmol/L (ref 22–32)
CREATININE: 1.01 mg/dL (ref 0.61–1.24)
Calcium: 9.4 mg/dL (ref 8.9–10.3)
GFR calc non Af Amer: >60 mL/min (ref >60)
Glucose, Bld: 113 mg/dL — ABNORMAL HIGH (ref 70–99)
Potassium: 3.4 mmol/L — ABNORMAL LOW (ref 3.5–5.1)
SODIUM: 137 mmol/L (ref 135–145)

## 2017-11-20 LAB — CBC WITH DIFFERENTIAL/PLATELET
Abs Immature Granulocytes: 0.04 10*3/uL (ref 0.00–0.07)
BASOS ABS: 0.1 10*3/uL (ref 0.0–0.1)
Basophils Relative: 1 %
EOS ABS: 0.2 10*3/uL (ref 0.0–0.5)
EOS PCT: 3 %
HEMATOCRIT: 45 % (ref 39.0–52.0)
HEMOGLOBIN: 14.3 g/dL (ref 13.0–17.0)
Immature Granulocytes: 1 %
Lymphocytes Relative: 36 %
Lymphs Abs: 2.1 10*3/uL (ref 0.7–4.0)
MCH: 29.1 pg (ref 26.0–34.0)
MCHC: 31.8 g/dL (ref 30.0–36.0)
MCV: 91.5 fL (ref 80.0–100.0)
Monocytes Absolute: 0.4 10*3/uL (ref 0.1–1.0)
Monocytes Relative: 6 %
NRBC: 0 % (ref 0.0–0.2)
Neutro Abs: 3.2 10*3/uL (ref 1.7–7.7)
Neutrophils Relative %: 53 %
Platelets: 356 10*3/uL (ref 150–400)
RBC: 4.92 MIL/uL (ref 4.22–5.81)
RDW: 11.4 % — AB (ref 11.5–15.5)
WBC: 5.9 10*3/uL (ref 4.0–10.5)

## 2017-11-20 LAB — I-STAT TROPONIN, ED: TROPONIN I, POC: 0 ng/mL (ref 0.00–0.08)

## 2017-11-20 LAB — BRAIN NATRIURETIC PEPTIDE: B Natriuretic Peptide: 11.6 pg/mL (ref 0.0–100.0)

## 2017-11-20 MED ORDER — FENTANYL CITRATE (PF) 100 MCG/2ML IJ SOLN
50.0000 ug | Freq: Once | INTRAMUSCULAR | Status: AC
  Administered 2017-11-20: 50 ug via INTRAVENOUS
  Filled 2017-11-20: qty 2

## 2017-11-20 MED ORDER — IOPAMIDOL (ISOVUE-370) INJECTION 76%
100.0000 mL | Freq: Once | INTRAVENOUS | Status: AC | PRN
  Administered 2017-11-20: 100 mL via INTRAVENOUS

## 2017-11-20 NOTE — ED Triage Notes (Signed)
Pt here for evaluation of bilateral hand swelling that has been going on the last day.  Hx of neurosarcoidosis with treatment at [facility].  Pt complains of pain to the hands with the increased swelling.  Pulses equal in both radials. A&Ox4 ambulatory to triage.
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
## 2017-11-20 NOTE — ED Notes (Signed)
Patient transported to CT 

## 2017-11-22 DIAGNOSIS — R109 Unspecified abdominal pain: Secondary | ICD-10-CM | POA: Diagnosis not present

## 2017-11-22 DIAGNOSIS — R918 Other nonspecific abnormal finding of lung field: Secondary | ICD-10-CM | POA: Diagnosis not present

## 2017-11-22 DIAGNOSIS — M7989 Other specified soft tissue disorders: Secondary | ICD-10-CM | POA: Diagnosis not present

## 2017-11-22 DIAGNOSIS — M79641 Pain in right hand: Secondary | ICD-10-CM | POA: Diagnosis not present

## 2017-11-22 DIAGNOSIS — H4922 Sixth [abducent] nerve palsy, left eye: Secondary | ICD-10-CM | POA: Diagnosis not present

## 2017-11-22 DIAGNOSIS — R531 Weakness: Secondary | ICD-10-CM | POA: Diagnosis not present

## 2017-11-22 DIAGNOSIS — D869 Sarcoidosis, unspecified: Secondary | ICD-10-CM | POA: Diagnosis not present

## 2017-11-23 DIAGNOSIS — D869 Sarcoidosis, unspecified: Secondary | ICD-10-CM | POA: Diagnosis not present

## 2017-12-01 ENCOUNTER — Ambulatory Visit (INDEPENDENT_AMBULATORY_CARE_PROVIDER_SITE_OTHER): Payer: 59 | Admitting: Physician Assistant

## 2017-12-01 ENCOUNTER — Encounter (INDEPENDENT_AMBULATORY_CARE_PROVIDER_SITE_OTHER): Payer: Self-pay | Admitting: Physician Assistant

## 2017-12-01 VITALS — BP 125/86 | HR 88 | Temp 98.1°F | Ht 71.0 in | Wt 273.0 lb

## 2017-12-01 DIAGNOSIS — Z6838 Body mass index (BMI) 38.0-38.9, adult: Secondary | ICD-10-CM

## 2017-12-01 DIAGNOSIS — I10 Essential (primary) hypertension: Secondary | ICD-10-CM | POA: Diagnosis not present

## 2017-12-01 DIAGNOSIS — E559 Vitamin D deficiency, unspecified: Secondary | ICD-10-CM

## 2017-12-01 DIAGNOSIS — Z9189 Other specified personal risk factors, not elsewhere classified: Secondary | ICD-10-CM | POA: Diagnosis not present

## 2017-12-01 MED ORDER — VITAMIN D (ERGOCALCIFEROL) 1.25 MG (50000 UNIT) PO CAPS: 50000.0000 [IU] | ORAL_CAPSULE | ORAL | 0 refills | Status: DC
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
## 2017-12-19 DIAGNOSIS — D8689 Sarcoidosis of other sites: Secondary | ICD-10-CM | POA: Diagnosis not present

## 2017-12-22 ENCOUNTER — Ambulatory Visit (INDEPENDENT_AMBULATORY_CARE_PROVIDER_SITE_OTHER): Payer: 59 | Admitting: Physician Assistant

## 2017-12-22 ENCOUNTER — Encounter (INDEPENDENT_AMBULATORY_CARE_PROVIDER_SITE_OTHER): Payer: Self-pay | Admitting: Physician Assistant

## 2017-12-22 VITALS — BP 128/85 | HR 65 | Temp 97.8°F | Ht 71.0 in | Wt 264.0 lb

## 2017-12-22 DIAGNOSIS — E559 Vitamin D deficiency, unspecified: Secondary | ICD-10-CM

## 2017-12-22 DIAGNOSIS — E7849 Other hyperlipidemia: Secondary | ICD-10-CM | POA: Diagnosis not present

## 2017-12-22 DIAGNOSIS — Z9189 Other specified personal risk factors, not elsewhere classified: Secondary | ICD-10-CM | POA: Diagnosis not present

## 2017-12-22 DIAGNOSIS — Z6836 Body mass index (BMI) 36.0-36.9, adult: Secondary | ICD-10-CM

## 2017-12-22 MED ORDER — VITAMIN D (ERGOCALCIFEROL) 1.25 MG (50000 UNIT) PO CAPS: 50000.0000 [IU] | ORAL_CAPSULE | ORAL | 0 refills | Status: DC
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
## 2018-01-12 ENCOUNTER — Encounter (INDEPENDENT_AMBULATORY_CARE_PROVIDER_SITE_OTHER): Payer: Self-pay | Admitting: Physician Assistant

## 2018-01-12 ENCOUNTER — Ambulatory Visit (INDEPENDENT_AMBULATORY_CARE_PROVIDER_SITE_OTHER): Payer: 59 | Admitting: Physician Assistant

## 2018-01-12 VITALS — BP 143/81 | HR 61 | Temp 98.0°F | Ht 71.0 in | Wt 262.0 lb

## 2018-01-12 DIAGNOSIS — I10 Essential (primary) hypertension: Secondary | ICD-10-CM | POA: Diagnosis not present

## 2018-01-12 DIAGNOSIS — E559 Vitamin D deficiency, unspecified: Secondary | ICD-10-CM

## 2018-01-12 DIAGNOSIS — D869 Sarcoidosis, unspecified: Secondary | ICD-10-CM | POA: Diagnosis not present

## 2018-01-12 DIAGNOSIS — Z6836 Body mass index (BMI) 36.0-36.9, adult: Secondary | ICD-10-CM

## 2018-01-12 DIAGNOSIS — Z9189 Other specified personal risk factors, not elsewhere classified: Secondary | ICD-10-CM | POA: Diagnosis not present

## 2018-01-12 MED ORDER — VITAMIN D (ERGOCALCIFEROL) 1.25 MG (50000 UNIT) PO CAPS: 50000.0000 [IU] | ORAL_CAPSULE | ORAL | 0 refills | Status: DC
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
## 2018-01-23 ENCOUNTER — Other Ambulatory Visit: Payer: Self-pay | Admitting: Internal Medicine

## 2018-02-01 HISTORY — PX: EYE MUSCLE SURGERY: SHX370

## 2018-02-06 ENCOUNTER — Other Ambulatory Visit (INDEPENDENT_AMBULATORY_CARE_PROVIDER_SITE_OTHER): Payer: BLUE CROSS/BLUE SHIELD

## 2018-02-06 DIAGNOSIS — M810 Age-related osteoporosis without current pathological fracture: Secondary | ICD-10-CM

## 2018-02-06 DIAGNOSIS — E230 Hypopituitarism: Secondary | ICD-10-CM

## 2018-02-06 LAB — VITAMIN D 25 HYDROXY (VIT D DEFICIENCY, FRACTURES): VITD: 60.78 ng/mL (ref 30.00–100.00)
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
## 2018-02-09 ENCOUNTER — Ambulatory Visit: Payer: BLUE CROSS/BLUE SHIELD | Admitting: Endocrinology

## 2018-02-09 ENCOUNTER — Ambulatory Visit: Payer: 59 | Admitting: Endocrinology

## 2018-02-09 ENCOUNTER — Encounter: Payer: Self-pay | Admitting: Endocrinology

## 2018-02-09 ENCOUNTER — Encounter (INDEPENDENT_AMBULATORY_CARE_PROVIDER_SITE_OTHER): Payer: Self-pay | Admitting: Physician Assistant

## 2018-02-09 ENCOUNTER — Ambulatory Visit (INDEPENDENT_AMBULATORY_CARE_PROVIDER_SITE_OTHER): Payer: BLUE CROSS/BLUE SHIELD | Admitting: Physician Assistant

## 2018-02-09 VITALS — BP 130/81 | HR 64 | Temp 98.0°F | Ht 71.0 in | Wt 259.0 lb

## 2018-02-09 VITALS — BP 130/82 | HR 70 | Ht 71.0 in | Wt 266.2 lb

## 2018-02-09 DIAGNOSIS — Z6836 Body mass index (BMI) 36.0-36.9, adult: Secondary | ICD-10-CM

## 2018-02-09 DIAGNOSIS — E230 Hypopituitarism: Secondary | ICD-10-CM | POA: Diagnosis not present

## 2018-02-09 DIAGNOSIS — I10 Essential (primary) hypertension: Secondary | ICD-10-CM

## 2018-02-09 LAB — TESTOSTERONE, TOTAL, LC/MS/MS: TESTOSTERONE, TOTAL, LC-MS-MS: 640 ng/dL (ref 250–1100)

## 2018-02-09 MED ORDER — RISEDRONATE SODIUM 150 MG PO TABS: 150.0000 mg | ORAL_TABLET | ORAL | 1 refills | Status: DC

## 2018-02-09 NOTE — Patient Instructions (Addendum)
Restart Risedronate  Vitamin D take 2x month only
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
## 2018-03-01 ENCOUNTER — Other Ambulatory Visit: Payer: Self-pay | Admitting: Internal Medicine

## 2018-03-01 ENCOUNTER — Other Ambulatory Visit: Payer: Self-pay | Admitting: Endocrinology

## 2018-03-01 NOTE — Telephone Encounter (Signed)
Please refill if appropriate

## 2018-03-02 ENCOUNTER — Ambulatory Visit (INDEPENDENT_AMBULATORY_CARE_PROVIDER_SITE_OTHER): Payer: BLUE CROSS/BLUE SHIELD | Admitting: Physician Assistant

## 2018-03-02 ENCOUNTER — Encounter (INDEPENDENT_AMBULATORY_CARE_PROVIDER_SITE_OTHER): Payer: Self-pay | Admitting: Physician Assistant

## 2018-03-02 VITALS — BP 137/90 | HR 82 | Temp 98.1°F | Ht 71.0 in | Wt 261.0 lb

## 2018-03-02 DIAGNOSIS — E7849 Other hyperlipidemia: Secondary | ICD-10-CM

## 2018-03-02 DIAGNOSIS — E559 Vitamin D deficiency, unspecified: Secondary | ICD-10-CM

## 2018-03-02 DIAGNOSIS — Z9189 Other specified personal risk factors, not elsewhere classified: Secondary | ICD-10-CM

## 2018-03-02 DIAGNOSIS — Z6836 Body mass index (BMI) 36.0-36.9, adult: Secondary | ICD-10-CM

## 2018-03-02 MED ORDER — VITAMIN D (ERGOCALCIFEROL) 1.25 MG (50000 UNIT) PO CAPS: 50000.0000 [IU] | ORAL_CAPSULE | ORAL | 0 refills | Status: DC
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
## 2018-03-03 ENCOUNTER — Telehealth: Payer: Self-pay | Admitting: Endocrinology

## 2018-03-03 NOTE — Telephone Encounter (Signed)
error 

## 2018-03-06 ENCOUNTER — Telehealth: Payer: Self-pay | Admitting: Endocrinology

## 2018-03-06 NOTE — Telephone Encounter (Signed)
Judeth Cornfield with [employer] is calling to finish up a PA over the phone for the patients medication and get clarification on this medication if this is for Generic.    Stephanie's direct line PHONE- [phone]

## 2018-03-06 NOTE — Telephone Encounter (Signed)
Called stephanie back and answered clinical questions again. PA was then sent to clinical team for review.

## 2018-03-06 NOTE — Telephone Encounter (Signed)
Called and left voicemail for Denham requesting a call back.

## 2018-03-08 DIAGNOSIS — Z9889 Other specified postprocedural states: Secondary | ICD-10-CM | POA: Diagnosis not present

## 2018-03-08 DIAGNOSIS — H50012 Monocular esotropia, left eye: Secondary | ICD-10-CM | POA: Diagnosis not present

## 2018-03-08 DIAGNOSIS — H519 Unspecified disorder of binocular movement: Secondary | ICD-10-CM | POA: Diagnosis not present

## 2018-03-08 DIAGNOSIS — H4922 Sixth [abducent] nerve palsy, left eye: Secondary | ICD-10-CM | POA: Diagnosis not present

## 2018-03-09 DIAGNOSIS — D869 Sarcoidosis, unspecified: Secondary | ICD-10-CM | POA: Diagnosis not present

## 2018-03-14 ENCOUNTER — Other Ambulatory Visit: Payer: BLUE CROSS/BLUE SHIELD | Admitting: Internal Medicine

## 2018-03-14 DIAGNOSIS — E786 Lipoprotein deficiency: Secondary | ICD-10-CM | POA: Diagnosis not present

## 2018-03-14 DIAGNOSIS — I10 Essential (primary) hypertension: Secondary | ICD-10-CM | POA: Diagnosis not present

## 2018-03-14 DIAGNOSIS — E8881 Metabolic syndrome: Secondary | ICD-10-CM

## 2018-03-15 LAB — BASIC METABOLIC PANEL
BUN: 21 mg/dL (ref 7–25)
CHLORIDE: 104 mmol/L (ref 98–110)
CO2: 32 mmol/L (ref 20–32)
CREATININE: 1 mg/dL (ref 0.60–1.35)
Calcium: 9.3 mg/dL (ref 8.6–10.3)
Glucose, Bld: 89 mg/dL (ref 65–99)
Potassium: 4.4 mmol/L (ref 3.5–5.3)
Sodium: 142 mmol/L (ref 135–146)

## 2018-03-15 LAB — LIPID PANEL
CHOL/HDL RATIO: 3.9 (calc) (ref <5.0)
Cholesterol: 167 mg/dL (ref <200)
HDL: 43 mg/dL (ref >=40)
LDL CHOLESTEROL (CALC): 102 mg/dL — AB
NON-HDL CHOLESTEROL (CALC): 124 mg/dL (ref <130)
Triglycerides: 122 mg/dL (ref <150)

## 2018-03-15 LAB — MICROALBUMIN / CREATININE URINE RATIO
Creatinine, Urine: 174 mg/dL (ref 20–320)
MICROALB UR: 0.3 mg/dL
MICROALB/CREAT RATIO: 2 ug/mg{creat} (ref <30)

## 2018-03-15 LAB — HEMOGLOBIN A1C
EAG (MMOL/L): 6.3 (calc)
Hgb A1c MFr Bld: 5.6 % of total Hgb (ref <5.7)
MEAN PLASMA GLUCOSE: 114 (calc)

## 2018-03-16 ENCOUNTER — Ambulatory Visit (INDEPENDENT_AMBULATORY_CARE_PROVIDER_SITE_OTHER): Payer: BLUE CROSS/BLUE SHIELD | Admitting: Internal Medicine

## 2018-03-16 ENCOUNTER — Encounter: Payer: Self-pay | Admitting: Internal Medicine

## 2018-03-16 VITALS — BP 130/90 | HR 85 | Temp 98.3°F | Ht 71.0 in

## 2018-03-16 DIAGNOSIS — I10 Essential (primary) hypertension: Secondary | ICD-10-CM | POA: Diagnosis not present

## 2018-03-16 DIAGNOSIS — E559 Vitamin D deficiency, unspecified: Secondary | ICD-10-CM | POA: Diagnosis not present

## 2018-03-16 DIAGNOSIS — E786 Lipoprotein deficiency: Secondary | ICD-10-CM | POA: Diagnosis not present

## 2018-03-16 DIAGNOSIS — H4922 Sixth [abducent] nerve palsy, left eye: Secondary | ICD-10-CM

## 2018-03-16 DIAGNOSIS — R7989 Other specified abnormal findings of blood chemistry: Secondary | ICD-10-CM

## 2018-03-16 DIAGNOSIS — R609 Edema, unspecified: Secondary | ICD-10-CM

## 2018-03-16 DIAGNOSIS — D8689 Sarcoidosis of other sites: Secondary | ICD-10-CM

## 2018-03-16 DIAGNOSIS — Z6836 Body mass index (BMI) 36.0-36.9, adult: Secondary | ICD-10-CM

## 2018-03-16 NOTE — Progress Notes (Addendum)
   Subjective:    Patient ID: Adrian [name], male    DOB: 28-Sep-1970, 48 y.o.   MRN: [id number]  HPI 48 year old Male with neurosarcoidosis treated and followed at [facility] for 6 month follow up. Has lost 43 pounds through weight loss clinic with [facility]. Going to get Remicade infusions q 2 months. Had eye surgery ay [facility] to correct sixth nerve palsy left eye by Dr. Lorina Rabon.  Hgb AIC is normal. LDL is 102. Electrolytes are normal.  Was seen at ED at [facility] in October with multiple arthralgias and was given IV steroids and oral prednisone taper. Now taking 10 mg prednisone daily. Now on remicade q 2 months. Hx HTN stable   Review of Systems weight in January was 261 pounds at weight loss clinic. Our scales are broken today     Objective:   Physical Exam BP 140/90 but usually much better. Took BP meds at 7 am. Skin warm and dry.  Nodes none.  Neck is supple.  No carotid bruits.  Slight residual 6th nerve palsy but markedly improved from before.  Chest is clear to auscultation.  Cardiac exam regular rate and rhythm.  Extremities without pitting edema.      Assessment & Plan:  BMI 36-continue with weight loss clinic  Neurosarcoidosis treated with Remicade at [facility] with left 6th nerve palsy being corrected by ophthalmology there with surgery.  Has had one surgery and has an upcoming additional surgery.  He is able to drive.  His spirits have improved considerably.  Currently on prednisone 10 mg daily.  History of vitamin D deficiency treated with vitamin D supplement.  Treated with high-dose vitamin D  Dependent edema treated with Lasix.  Osteopenia-treated with Actonel  GE reflux treated with Protonix  Low testosterone treated with topical testosterone supplement  Essential hypertension treated with Coreg and also takes Lasix for edema  Left sixth nerve palsy  Plan: Return in 6 months for follow-up.  Continue same medications.        Essential hypertension-stable.  He  needs to monitor this at home.  Continue current medications for hypertension.  These include Benicar Lasix amlodipine and Coreg.

## 2018-03-20 ENCOUNTER — Other Ambulatory Visit: Payer: Self-pay | Admitting: Internal Medicine

## 2018-03-20 DIAGNOSIS — I10 Essential (primary) hypertension: Secondary | ICD-10-CM

## 2018-03-22 ENCOUNTER — Encounter (INDEPENDENT_AMBULATORY_CARE_PROVIDER_SITE_OTHER): Payer: Self-pay | Admitting: Physician Assistant

## 2018-03-22 ENCOUNTER — Ambulatory Visit (INDEPENDENT_AMBULATORY_CARE_PROVIDER_SITE_OTHER): Payer: BLUE CROSS/BLUE SHIELD | Admitting: Physician Assistant

## 2018-03-22 VITALS — BP 134/87 | HR 70 | Temp 98.5°F | Ht 71.0 in | Wt 258.0 lb

## 2018-03-22 DIAGNOSIS — E559 Vitamin D deficiency, unspecified: Secondary | ICD-10-CM

## 2018-03-22 DIAGNOSIS — Z9189 Other specified personal risk factors, not elsewhere classified: Secondary | ICD-10-CM

## 2018-03-22 DIAGNOSIS — E8881 Metabolic syndrome: Secondary | ICD-10-CM

## 2018-03-22 DIAGNOSIS — Z6836 Body mass index (BMI) 36.0-36.9, adult: Secondary | ICD-10-CM | POA: Diagnosis not present

## 2018-03-22 MED ORDER — VITAMIN D (ERGOCALCIFEROL) 1.25 MG (50000 UNIT) PO CAPS: 50000.0000 [IU] | ORAL_CAPSULE | ORAL | 0 refills | Status: DC
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
## 2018-03-23 ENCOUNTER — Encounter: Payer: Self-pay | Admitting: Internal Medicine

## 2018-03-23 LAB — COMPREHENSIVE METABOLIC PANEL
A/G RATIO: 1.5 (ref 1.2–2.2)
ALBUMIN: 4.1 g/dL (ref 4.0–5.0)
ALT: 32 IU/L (ref 0–44)
AST: 26 IU/L (ref 0–40)
Alkaline Phosphatase: 47 IU/L (ref 39–117)
BILIRUBIN TOTAL: 0.6 mg/dL (ref 0.0–1.2)
BUN / CREAT RATIO: 22 — AB (ref 9–20)
BUN: 21 mg/dL (ref 6–24)
CO2: 23 mmol/L (ref 20–29)
Calcium: 9.2 mg/dL (ref 8.7–10.2)
Chloride: 103 mmol/L (ref 96–106)
Creatinine, Ser: 0.94 mg/dL (ref 0.76–1.27)
GFR calc non Af Amer: 95 mL/min/{1.73_m2} (ref >59)
GFR, EST AFRICAN AMERICAN: 110 mL/min/{1.73_m2} (ref >59)
GLOBULIN, TOTAL: 2.7 g/dL (ref 1.5–4.5)
Glucose: 88 mg/dL (ref 65–99)
POTASSIUM: 4 mmol/L (ref 3.5–5.2)
SODIUM: 142 mmol/L (ref 134–144)
TOTAL PROTEIN: 6.8 g/dL (ref 6.0–8.5)

## 2018-03-23 LAB — VITAMIN D 25 HYDROXY (VIT D DEFICIENCY, FRACTURES): Vit D, 25-Hydroxy: 43.4 ng/mL (ref 30.0–100.0)

## 2018-03-23 LAB — HEMOGLOBIN A1C
Est. average glucose Bld gHb Est-mCnc: 108 mg/dL
Hgb A1c MFr Bld: 5.4 % (ref 4.8–5.6)

## 2018-03-23 LAB — INSULIN, RANDOM: INSULIN: 12.9 u[IU]/mL (ref 2.6–24.9)

## 2018-03-23 NOTE — Patient Instructions (Signed)
It was a pleasure to see you today. Continue same meds and follow up in 6 months. Watch BP at home and let us know if persistently elevated

## 2018-04-11 DIAGNOSIS — Z79899 Other long term (current) drug therapy: Secondary | ICD-10-CM | POA: Diagnosis not present

## 2018-04-11 DIAGNOSIS — H4922 Sixth [abducent] nerve palsy, left eye: Secondary | ICD-10-CM | POA: Diagnosis not present

## 2018-04-11 DIAGNOSIS — Z7952 Long term (current) use of systemic steroids: Secondary | ICD-10-CM | POA: Diagnosis not present

## 2018-04-11 DIAGNOSIS — H50012 Monocular esotropia, left eye: Secondary | ICD-10-CM | POA: Diagnosis not present

## 2018-04-11 DIAGNOSIS — H519 Unspecified disorder of binocular movement: Secondary | ICD-10-CM | POA: Diagnosis not present

## 2018-04-11 DIAGNOSIS — H5022 Vertical strabismus, left eye: Secondary | ICD-10-CM | POA: Diagnosis not present

## 2018-04-11 DIAGNOSIS — H5989 Other postprocedural complications and disorders of eye and adnexa, not elsewhere classified: Secondary | ICD-10-CM | POA: Diagnosis not present

## 2018-04-11 DIAGNOSIS — Z87891 Personal history of nicotine dependence: Secondary | ICD-10-CM | POA: Diagnosis not present

## 2018-04-11 DIAGNOSIS — Z9889 Other specified postprocedural states: Secondary | ICD-10-CM | POA: Diagnosis not present

## 2018-04-17 ENCOUNTER — Encounter (INDEPENDENT_AMBULATORY_CARE_PROVIDER_SITE_OTHER): Payer: Self-pay

## 2018-04-19 ENCOUNTER — Ambulatory Visit (INDEPENDENT_AMBULATORY_CARE_PROVIDER_SITE_OTHER): Payer: BLUE CROSS/BLUE SHIELD | Admitting: Physician Assistant

## 2018-04-19 ENCOUNTER — Other Ambulatory Visit: Payer: Self-pay

## 2018-04-19 VITALS — BP 126/84 | HR 68 | Temp 97.9°F | Ht 71.0 in | Wt 259.0 lb

## 2018-04-19 DIAGNOSIS — Z9889 Other specified postprocedural states: Secondary | ICD-10-CM | POA: Insufficient documentation

## 2018-04-19 DIAGNOSIS — E559 Vitamin D deficiency, unspecified: Secondary | ICD-10-CM

## 2018-04-19 DIAGNOSIS — Z6836 Body mass index (BMI) 36.0-36.9, adult: Secondary | ICD-10-CM

## 2018-04-19 DIAGNOSIS — H4922 Sixth [abducent] nerve palsy, left eye: Secondary | ICD-10-CM | POA: Diagnosis not present

## 2018-04-19 DIAGNOSIS — Z9189 Other specified personal risk factors, not elsewhere classified: Secondary | ICD-10-CM | POA: Diagnosis not present

## 2018-04-19 DIAGNOSIS — E8881 Metabolic syndrome: Secondary | ICD-10-CM

## 2018-04-19 MED ORDER — VITAMIN D (ERGOCALCIFEROL) 1.25 MG (50000 UNIT) PO CAPS: 50000.0000 [IU] | ORAL_CAPSULE | ORAL | 0 refills | Status: DC
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
## 2018-04-25 ENCOUNTER — Other Ambulatory Visit: Payer: Self-pay | Admitting: Internal Medicine

## 2018-04-25 ENCOUNTER — Encounter (INDEPENDENT_AMBULATORY_CARE_PROVIDER_SITE_OTHER): Payer: Self-pay

## 2018-05-04 DIAGNOSIS — D8689 Sarcoidosis of other sites: Secondary | ICD-10-CM | POA: Diagnosis not present

## 2018-05-04 DIAGNOSIS — D869 Sarcoidosis, unspecified: Secondary | ICD-10-CM | POA: Diagnosis not present

## 2018-05-10 ENCOUNTER — Ambulatory Visit (INDEPENDENT_AMBULATORY_CARE_PROVIDER_SITE_OTHER): Payer: BLUE CROSS/BLUE SHIELD | Admitting: Physician Assistant

## 2018-05-10 ENCOUNTER — Other Ambulatory Visit: Payer: Self-pay

## 2018-05-10 ENCOUNTER — Encounter (INDEPENDENT_AMBULATORY_CARE_PROVIDER_SITE_OTHER): Payer: Self-pay | Admitting: Physician Assistant

## 2018-05-10 DIAGNOSIS — Z6836 Body mass index (BMI) 36.0-36.9, adult: Secondary | ICD-10-CM | POA: Diagnosis not present

## 2018-05-10 DIAGNOSIS — E7849 Other hyperlipidemia: Secondary | ICD-10-CM
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
## 2018-05-22 ENCOUNTER — Encounter (INDEPENDENT_AMBULATORY_CARE_PROVIDER_SITE_OTHER): Payer: Self-pay | Admitting: Physician Assistant

## 2018-05-25 ENCOUNTER — Encounter (INDEPENDENT_AMBULATORY_CARE_PROVIDER_SITE_OTHER): Payer: Self-pay | Admitting: Physician Assistant

## 2018-05-25 ENCOUNTER — Other Ambulatory Visit: Payer: Self-pay

## 2018-05-25 ENCOUNTER — Ambulatory Visit (INDEPENDENT_AMBULATORY_CARE_PROVIDER_SITE_OTHER): Payer: BLUE CROSS/BLUE SHIELD | Admitting: Physician Assistant

## 2018-05-25 DIAGNOSIS — E8881 Metabolic syndrome: Secondary | ICD-10-CM | POA: Diagnosis not present

## 2018-05-25 DIAGNOSIS — Z6836 Body mass index (BMI) 36.0-36.9, adult: Secondary | ICD-10-CM | POA: Diagnosis not present

## 2018-05-25 DIAGNOSIS — E559 Vitamin D deficiency, unspecified: Secondary | ICD-10-CM

## 2018-05-25 MED ORDER — VITAMIN D (ERGOCALCIFEROL) 1.25 MG (50000 UNIT) PO CAPS: 50000.0000 [IU] | ORAL_CAPSULE | ORAL | 0 refills | Status: DC
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
## 2018-05-31 ENCOUNTER — Encounter: Payer: Self-pay | Admitting: Internal Medicine

## 2018-05-31 LAB — HM DIABETES EYE EXAM

## 2018-06-09 ENCOUNTER — Encounter (INDEPENDENT_AMBULATORY_CARE_PROVIDER_SITE_OTHER): Payer: Self-pay | Admitting: Physician Assistant

## 2018-06-12 ENCOUNTER — Other Ambulatory Visit: Payer: Self-pay | Admitting: Endocrinology

## 2018-06-12 ENCOUNTER — Other Ambulatory Visit (INDEPENDENT_AMBULATORY_CARE_PROVIDER_SITE_OTHER): Payer: BLUE CROSS/BLUE SHIELD

## 2018-06-12 ENCOUNTER — Other Ambulatory Visit: Payer: Self-pay

## 2018-06-12 DIAGNOSIS — M810 Age-related osteoporosis without current pathological fracture: Secondary | ICD-10-CM

## 2018-06-12 DIAGNOSIS — E230 Hypopituitarism: Secondary | ICD-10-CM

## 2018-06-12 LAB — BASIC METABOLIC PANEL
BUN: 20 mg/dL (ref 6–23)
CO2: 25 mEq/L (ref 19–32)
Calcium: 8.5 mg/dL (ref 8.4–10.5)
Chloride: 106 mEq/L (ref 96–112)
Creatinine, Ser: 0.94 mg/dL (ref 0.40–1.50)
GFR: 85.41 mL/min (ref >60.00)
Glucose, Bld: 93 mg/dL (ref 70–99)
Potassium: 3.5 mEq/L (ref 3.5–5.1)
Sodium: 139 mEq/L (ref 135–145)

## 2018-06-12 LAB — VITAMIN D 25 HYDROXY (VIT D DEFICIENCY, FRACTURES): VITD: 53.76 ng/mL (ref 30.00–100.00)

## 2018-06-12 LAB — CBC
HCT: 40.3 % (ref 39.0–52.0)
Hemoglobin: 14.1 g/dL (ref 13.0–17.0)
MCHC: 34.9 g/dL (ref 30.0–36.0)
MCV: 91.6 fl (ref 78.0–100.0)
Platelets: 204 10*3/uL (ref 150.0–400.0)
RBC: 4.41 Mil/uL (ref 4.22–5.81)
RDW: 12.5 % (ref 11.5–15.5)
WBC: 4.4 10*3/uL (ref 4.0–10.5)

## 2018-06-12 LAB — TESTOSTERONE: Testosterone: 258.67 ng/dL — ABNORMAL LOW (ref 300.00–890.00)

## 2018-06-13 ENCOUNTER — Ambulatory Visit (INDEPENDENT_AMBULATORY_CARE_PROVIDER_SITE_OTHER): Payer: BLUE CROSS/BLUE SHIELD | Admitting: Physician Assistant

## 2018-06-15 ENCOUNTER — Other Ambulatory Visit: Payer: Self-pay

## 2018-06-15 ENCOUNTER — Ambulatory Visit (INDEPENDENT_AMBULATORY_CARE_PROVIDER_SITE_OTHER): Payer: BLUE CROSS/BLUE SHIELD | Admitting: Endocrinology

## 2018-06-15 ENCOUNTER — Encounter: Payer: Self-pay | Admitting: Endocrinology

## 2018-06-15 VITALS — BP 122/82 | HR 82 | Ht 71.0 in | Wt 271.0 lb

## 2018-06-15 DIAGNOSIS — E230 Hypopituitarism: Secondary | ICD-10-CM | POA: Diagnosis not present

## 2018-06-15 DIAGNOSIS — M810 Age-related osteoporosis without current pathological fracture: Secondary | ICD-10-CM | POA: Diagnosis not present

## 2018-06-15 MED ORDER — TESTOSTERONE UNDECANOATE 158 MG PO CAPS: 158.0000 mg | ORAL_CAPSULE | Freq: Two times a day (BID) | ORAL | 2 refills | Status: DC

## 2018-06-15 NOTE — Patient Instructions (Signed)
Oral Testosterone: with food 2x daily  Copay card: [email or web address]_[email or web address]
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
## 2018-06-16 ENCOUNTER — Telehealth: Payer: Self-pay

## 2018-06-16 NOTE — Telephone Encounter (Signed)
PA submitted via [email or web address] for Jatenza medication. Andersen Glauser (Key: [id number]) Rx #: [id number] Brooks Sailors 158MG  capsules   Form [employer]  [employer] Form (CB)

## 2018-06-16 NOTE — Telephone Encounter (Signed)
New PA submitted for Testosterone 1.62% gel, applying 5grams topically per day. Key: [id number]

## 2018-06-16 NOTE — Telephone Encounter (Signed)
Need to do a PA for testosterone 1.62%, same dose as previous testosterone

## 2018-06-16 NOTE — Telephone Encounter (Signed)
Received fax from [facility] [place] stating that the pt was denied for [facility] for the following reason(s): The patient has only tried one of the preferred medications, which is Testosterone 1% gel.   Alternative medications are: Testosterone 1.62% gel, Testosterone solution (generic Axiron), and Testosterone Cypionate injections.  The other alternatives still require PA process.  Would you like to change to one of these medications?

## 2018-06-19 NOTE — Telephone Encounter (Signed)
Received notification from [facility] that PA for Testosterone 1.62% gel has been approved 06/16/18 through 01/31/2038. Ref#[id number]. Documents have been labeled and placed in scan file for HIM and for our future reference.

## 2018-06-21 ENCOUNTER — Ambulatory Visit (INDEPENDENT_AMBULATORY_CARE_PROVIDER_SITE_OTHER): Payer: BLUE CROSS/BLUE SHIELD | Admitting: Physician Assistant

## 2018-06-21 ENCOUNTER — Encounter (INDEPENDENT_AMBULATORY_CARE_PROVIDER_SITE_OTHER): Payer: Self-pay | Admitting: Physician Assistant

## 2018-06-21 ENCOUNTER — Other Ambulatory Visit: Payer: Self-pay

## 2018-06-21 DIAGNOSIS — Z6836 Body mass index (BMI) 36.0-36.9, adult: Secondary | ICD-10-CM | POA: Diagnosis not present

## 2018-06-21 DIAGNOSIS — E559 Vitamin D deficiency, unspecified: Secondary | ICD-10-CM | POA: Diagnosis not present

## 2018-06-21 DIAGNOSIS — I10 Essential (primary) hypertension: Secondary | ICD-10-CM | POA: Diagnosis not present

## 2018-06-21 MED ORDER — VITAMIN D (ERGOCALCIFEROL) 1.25 MG (50000 UNIT) PO CAPS: 50000.0000 [IU] | ORAL_CAPSULE | ORAL | 0 refills | Status: DC
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
## 2018-06-29 DIAGNOSIS — D869 Sarcoidosis, unspecified: Secondary | ICD-10-CM | POA: Diagnosis not present

## 2018-06-29 DIAGNOSIS — D8689 Sarcoidosis of other sites: Secondary | ICD-10-CM | POA: Diagnosis not present

## 2018-07-04 DIAGNOSIS — Z79899 Other long term (current) drug therapy: Secondary | ICD-10-CM | POA: Diagnosis not present

## 2018-07-04 DIAGNOSIS — D8689 Sarcoidosis of other sites: Secondary | ICD-10-CM | POA: Diagnosis not present

## 2018-07-05 ENCOUNTER — Other Ambulatory Visit: Payer: Self-pay

## 2018-07-05 ENCOUNTER — Ambulatory Visit (INDEPENDENT_AMBULATORY_CARE_PROVIDER_SITE_OTHER): Payer: BC Managed Care – PPO | Admitting: Bariatrics

## 2018-07-05 ENCOUNTER — Encounter (INDEPENDENT_AMBULATORY_CARE_PROVIDER_SITE_OTHER): Payer: Self-pay | Admitting: Bariatrics

## 2018-07-05 DIAGNOSIS — E559 Vitamin D deficiency, unspecified: Secondary | ICD-10-CM | POA: Diagnosis not present

## 2018-07-05 DIAGNOSIS — Z6836 Body mass index (BMI) 36.0-36.9, adult: Secondary | ICD-10-CM

## 2018-07-05 DIAGNOSIS — I10 Essential (primary) hypertension: Secondary | ICD-10-CM

## 2018-07-05 MED ORDER — VITAMIN D (ERGOCALCIFEROL) 1.25 MG (50000 UNIT) PO CAPS: 50000.0000 [IU] | ORAL_CAPSULE | ORAL | 0 refills | Status: DC
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
## 2018-07-09 IMAGING — CR DG CHEST 2V
2 series · 2 of 2 positions shown · non-contrast
Comparison: None.

CLINICAL DATA: 45 y/o M; 45 y/o M; suspicion for neurosarcoidosis.
Looking for evidence of sarcoidosis on chest radiograph.

EXAM:
CHEST  2 VIEW

[chest lat]
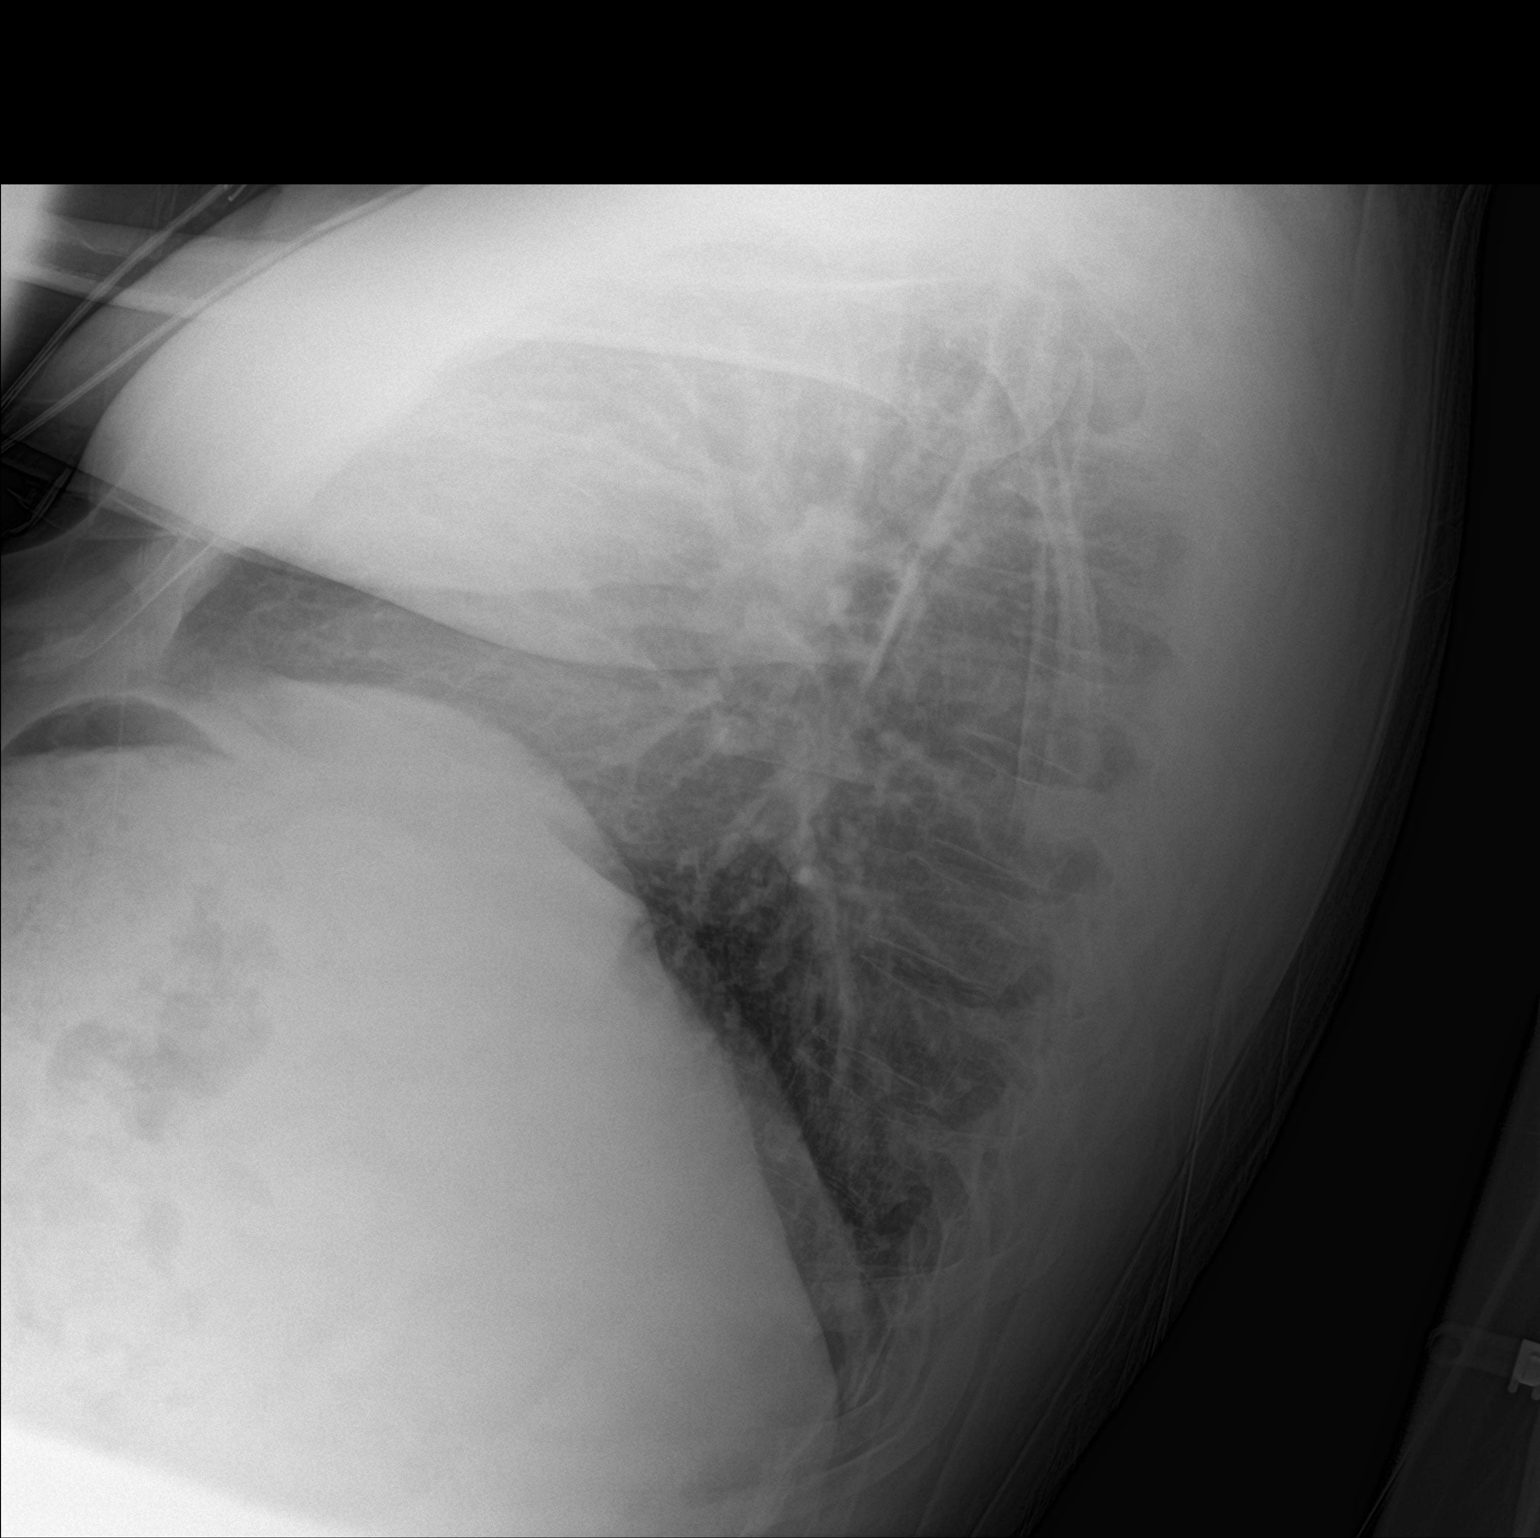

[chest ap]
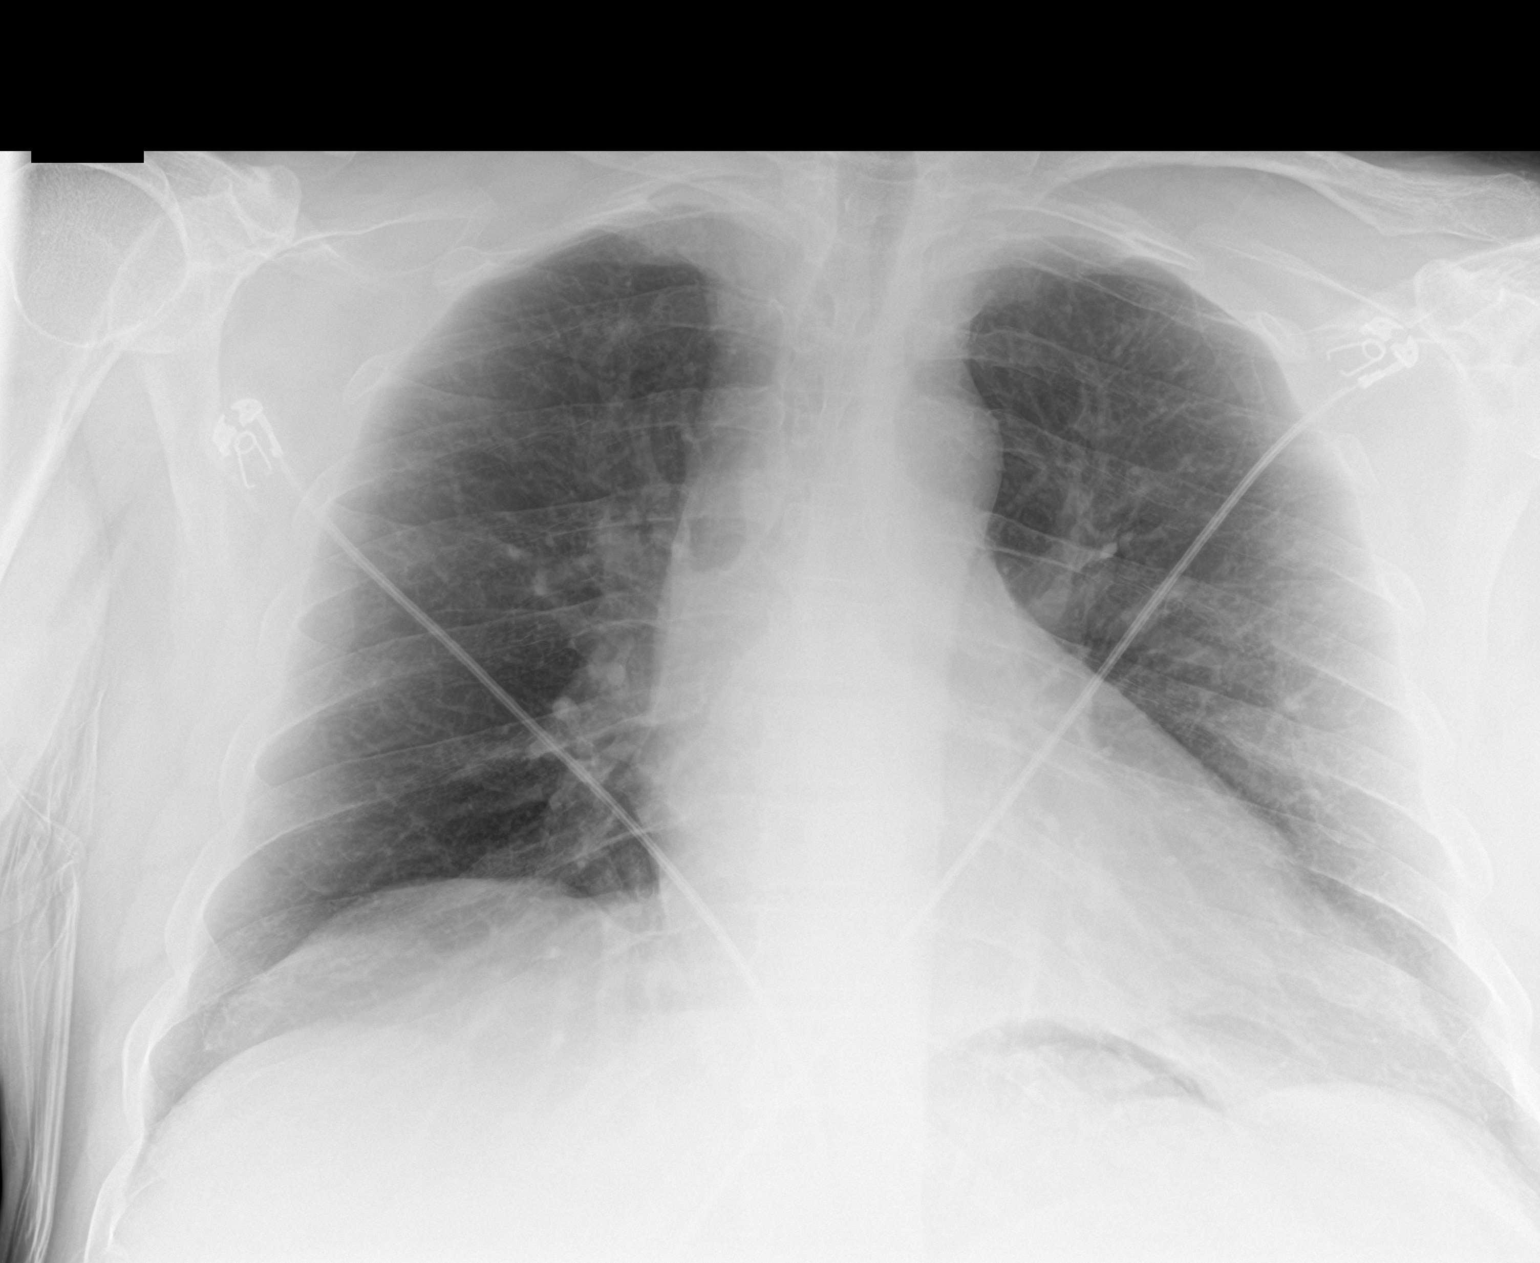

[2 of 2 positions shown; findings below may reference images not displayed]

FINDINGS: The heart size and mediastinal contours are within normal limits.
Both lungs are clear. The visualized skeletal structures are
unremarkable.
IMPRESSION: No definite findings of sarcoidosis. CT of the chest has higher
sensitivity for mediastinal adenopathy.

By: Kiri Jim M.D.

## 2018-07-09 IMAGING — CT CT HEAD W/O CM
3 series · 15 of 47 positions shown, 18 images · non-contrast
Comparison: None available

CLINICAL DATA: Dizziness, blurred vision

EXAM:
CT HEAD WITHOUT CONTRAST
TECHNIQUE: Contiguous axial images were obtained from the base of the skull
through the vertex without intravenous contrast.

[Series 3: head 5.0 h30s · axial · 0.44mm/px · z∈[-74,+61]mm · 9 of 33 slices shown, 12 images]
[im 3/33  brain]
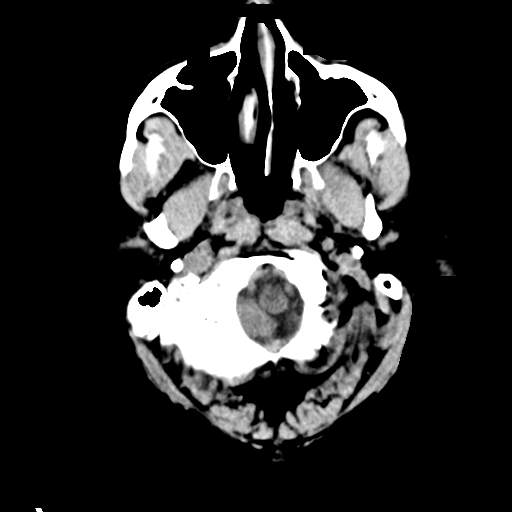
[im 3/33  bone]
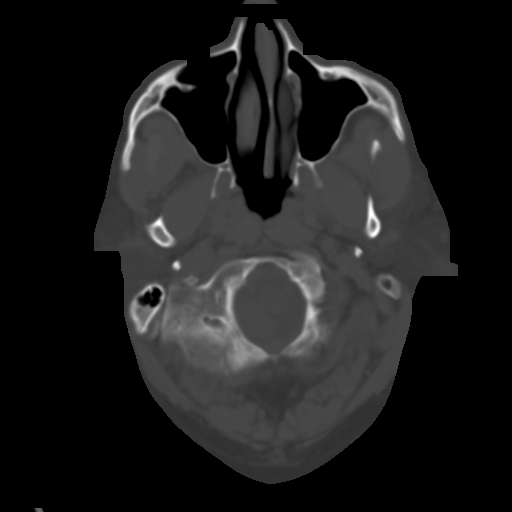
[im 6/33  brain]
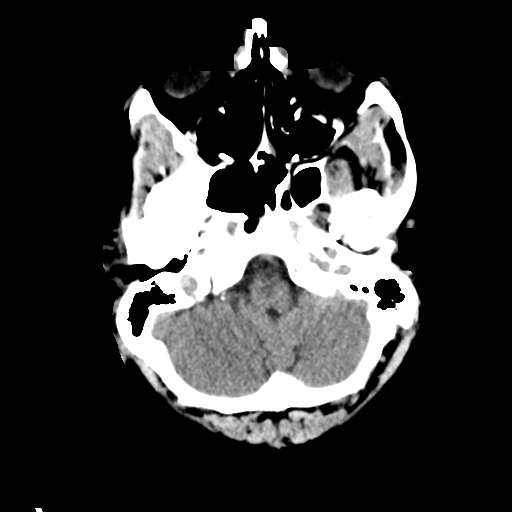
[im 9/33  brain]
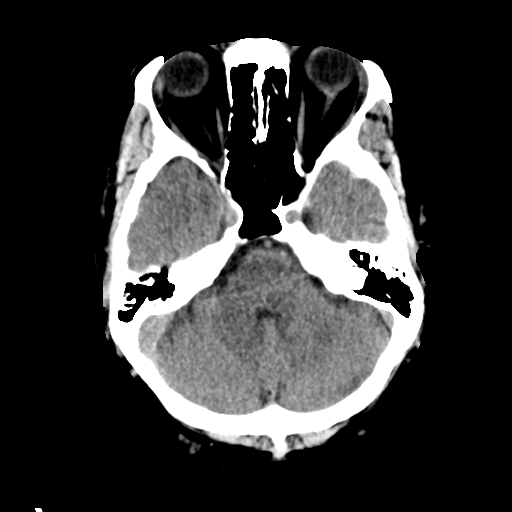
[im 13/33  brain]
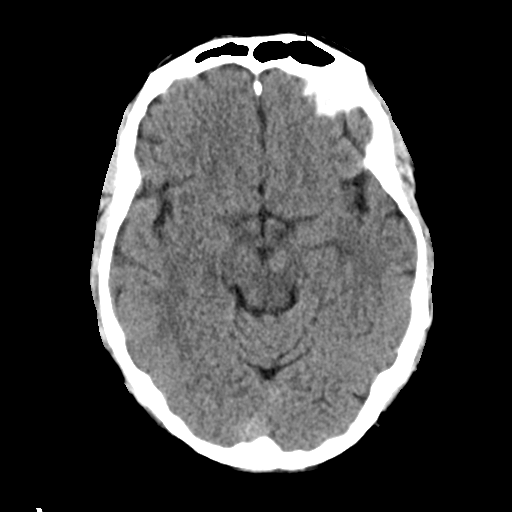
[im 17/33  brain]
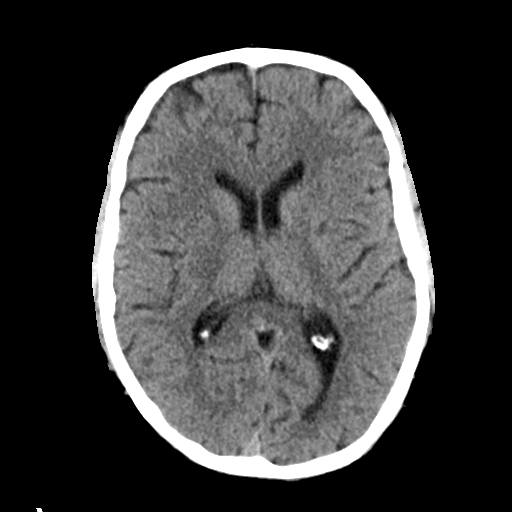
[im 17/33  bone]
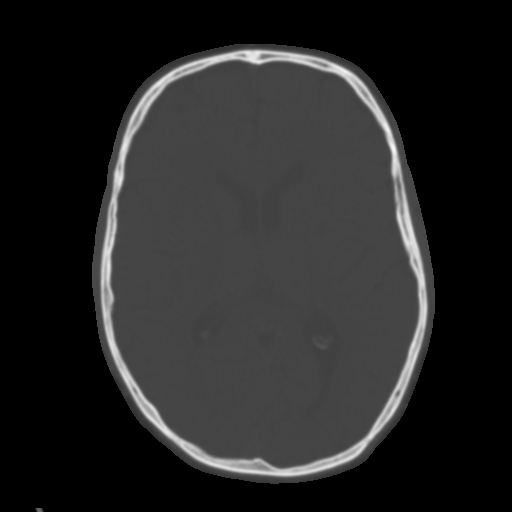
[im 20/33  brain]
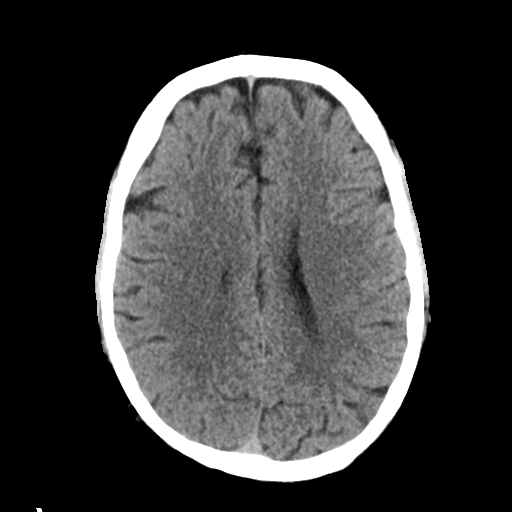
[im 24/33  brain]
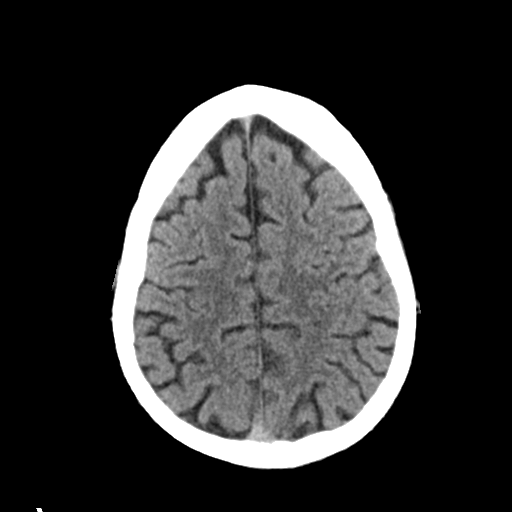
[im 27/33  brain]
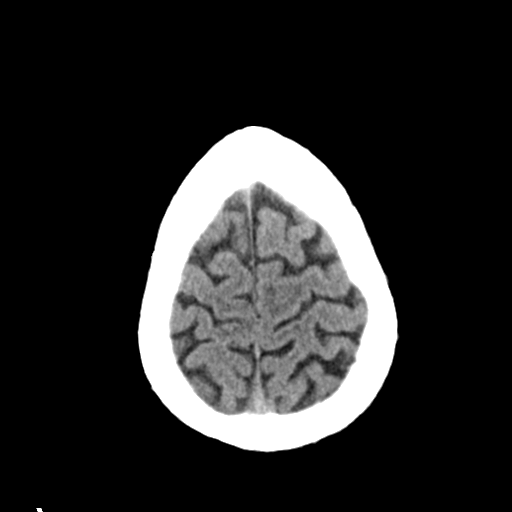
[im 30/33  brain]
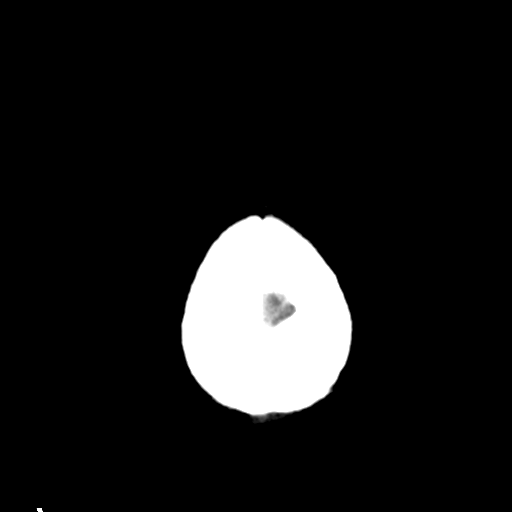
[im 30/33  bone]
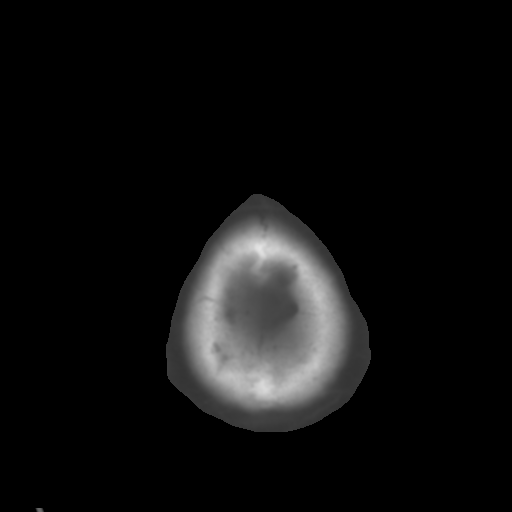

[Series 5: head 3.0 mpr cor · coronal · 0.32mm/px · 3 of 72 slices shown]
[im 24/72  brain]
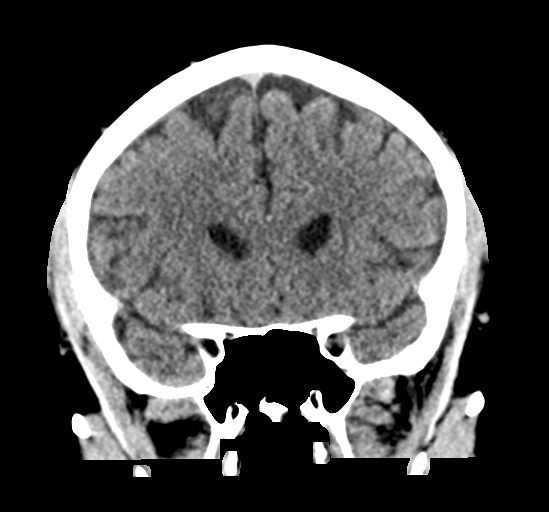
[im 32/72  brain]
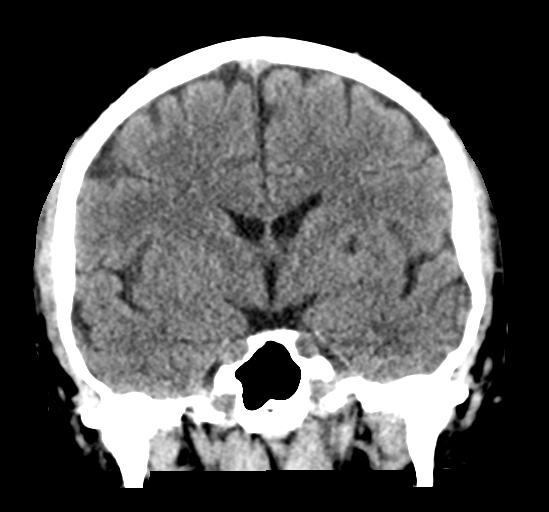
[im 40/72  brain]
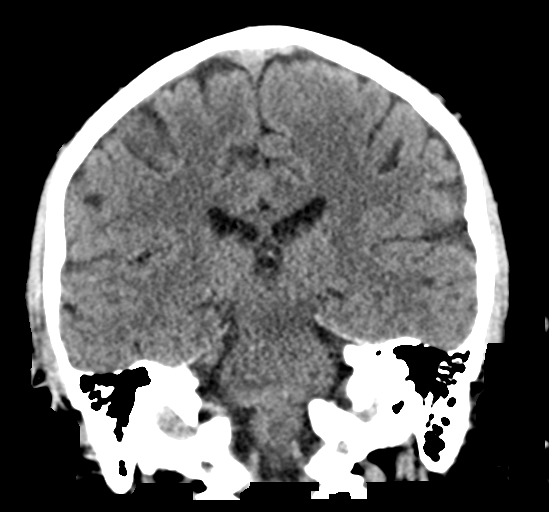

[Series 6: head 3.0 mpr sag · sagittal · 0.32mm/px · 3 of 60 slices shown]
[im 20/60  brain]
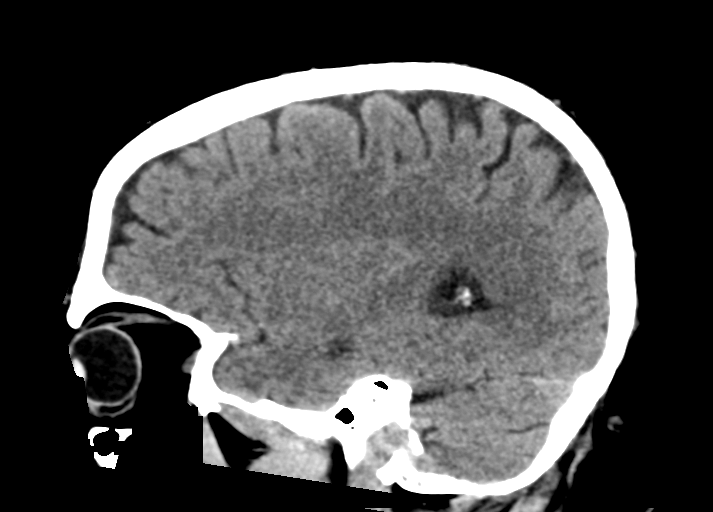
[im 30/60  brain]
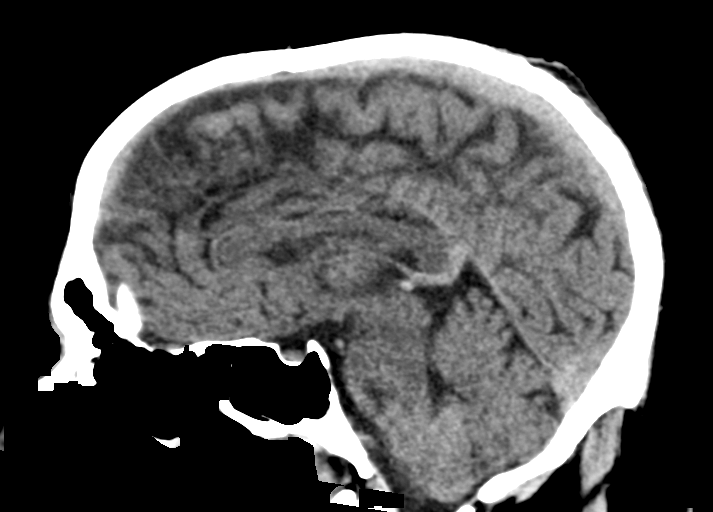
[im 40/60  brain]
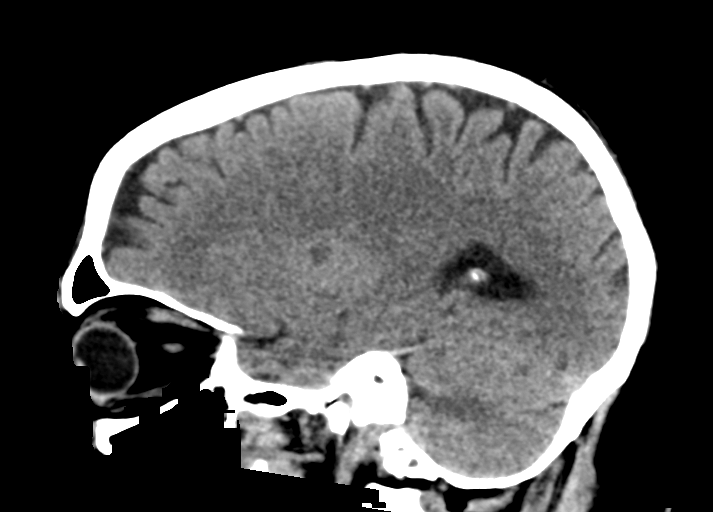

[15 of 47 positions shown; findings below may reference images not displayed]

FINDINGS: Brain: Mild brain atrophy pattern. No acute intracranial hemorrhage,
mass lesion, new infarction, midline shift, herniation,
hydrocephalus, or extra-axial fluid collection. Normal gray-white
matter differentiation. No focal mass effect or edema. Cisterns are
patent. No cerebellar abnormality.

Vascular: No hyperdense vessel or unexpected calcification.

Skull: Normal. Negative for fracture or focal lesion.

Sinuses/Orbits: No acute finding.

Other: None.
IMPRESSION: Mild brain atrophy pattern. No acute intracranial abnormality by
noncontrast CT.

## 2018-07-10 DIAGNOSIS — E559 Vitamin D deficiency, unspecified: Secondary | ICD-10-CM | POA: Insufficient documentation

## 2018-07-10 DIAGNOSIS — Z6836 Body mass index (BMI) 36.0-36.9, adult: Secondary | ICD-10-CM | POA: Insufficient documentation

## 2018-07-10 DIAGNOSIS — E66812 Obesity, class 2: Secondary | ICD-10-CM | POA: Insufficient documentation

## 2018-07-18 ENCOUNTER — Telehealth (INDEPENDENT_AMBULATORY_CARE_PROVIDER_SITE_OTHER): Payer: BC Managed Care – PPO | Admitting: Physician Assistant

## 2018-07-18 ENCOUNTER — Other Ambulatory Visit: Payer: Self-pay

## 2018-07-18 ENCOUNTER — Encounter (INDEPENDENT_AMBULATORY_CARE_PROVIDER_SITE_OTHER): Payer: Self-pay | Admitting: Physician Assistant

## 2018-07-18 DIAGNOSIS — E559 Vitamin D deficiency, unspecified: Secondary | ICD-10-CM

## 2018-07-18 DIAGNOSIS — Z6836 Body mass index (BMI) 36.0-36.9, adult: Secondary | ICD-10-CM

## 2018-07-18 DIAGNOSIS — I10 Essential (primary) hypertension: Secondary | ICD-10-CM
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
## 2018-07-20 ENCOUNTER — Other Ambulatory Visit: Payer: Self-pay

## 2018-07-20 ENCOUNTER — Other Ambulatory Visit (INDEPENDENT_AMBULATORY_CARE_PROVIDER_SITE_OTHER): Payer: BC Managed Care – PPO

## 2018-07-20 DIAGNOSIS — E230 Hypopituitarism: Secondary | ICD-10-CM

## 2018-07-20 LAB — TESTOSTERONE: Testosterone: 281.98 ng/dL — ABNORMAL LOW (ref 300.00–890.00)

## 2018-07-20 MED ORDER — VITAMIN D (ERGOCALCIFEROL) 1.25 MG (50000 UNIT) PO CAPS: 50000.0000 [IU] | ORAL_CAPSULE | ORAL | 0 refills | Status: DC

## 2018-07-24 ENCOUNTER — Other Ambulatory Visit: Payer: Self-pay

## 2018-07-24 MED ORDER — JATENZO 198 MG PO CAPS: 2.0000 | ORAL_CAPSULE | Freq: Every day | ORAL | 3 refills | Status: DC

## 2018-07-25 ENCOUNTER — Telehealth: Payer: Self-pay

## 2018-07-25 NOTE — Telephone Encounter (Signed)
PA completed via [email or web address] for Jatenzo 158mg  tablets 2 tabs PO QD. Key: [id number] Rx #: [id number]

## 2018-08-01 ENCOUNTER — Encounter (INDEPENDENT_AMBULATORY_CARE_PROVIDER_SITE_OTHER): Payer: Self-pay | Admitting: Physician Assistant

## 2018-08-01 ENCOUNTER — Other Ambulatory Visit: Payer: Self-pay

## 2018-08-01 ENCOUNTER — Telehealth (INDEPENDENT_AMBULATORY_CARE_PROVIDER_SITE_OTHER): Payer: BC Managed Care – PPO | Admitting: Physician Assistant

## 2018-08-01 DIAGNOSIS — E785 Hyperlipidemia, unspecified: Secondary | ICD-10-CM | POA: Diagnosis not present

## 2018-08-01 DIAGNOSIS — Z6836 Body mass index (BMI) 36.0-36.9, adult: Secondary | ICD-10-CM | POA: Diagnosis not present

## 2018-08-01 DIAGNOSIS — E559 Vitamin D deficiency, unspecified: Secondary | ICD-10-CM

## 2018-08-01 NOTE — Telephone Encounter (Signed)
Received notification from [place] that Appeal for Adrian [name] has been approved 06/16/18 through 06/14/2021. Documents have been labeled and placed in scan file for HIM and for our future reference.
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
## 2018-08-14 DIAGNOSIS — D869 Sarcoidosis, unspecified: Secondary | ICD-10-CM | POA: Diagnosis not present

## 2018-08-15 ENCOUNTER — Other Ambulatory Visit: Payer: Self-pay | Admitting: Endocrinology

## 2018-08-15 MED ORDER — JATENZO 158 MG PO CAPS: 1.0000 | ORAL_CAPSULE | Freq: Two times a day (BID) | ORAL | 1 refills | Status: DC

## 2018-08-15 NOTE — Telephone Encounter (Signed)
.  MEDICATION: Testosterone Undecanoate (JATENZO) 198 MG CAPS  PHARMACY:  [facility]  IS THIS A 90 DAY SUPPLY :   IS PATIENT OUT OF MEDICATION:   IF NOT; HOW MUCH IS LEFT:   LAST APPOINTMENT DATE: @6 /22/2020  NEXT APPOINTMENT DATE:@8 /11/2018  DO WE HAVE YOUR PERMISSION TO LEAVE A DETAILED MESSAGE:  OTHER COMMENTS:    **Let patient know to contact pharmacy at the end of the day to make sure medication is ready. **  ** Please notify patient to allow 48-72 hours to process**  **Encourage patient to contact the pharmacy for refills or they can request refills through [facility]**

## 2018-08-15 NOTE — Telephone Encounter (Signed)
Please refill if approproate

## 2018-08-15 NOTE — Telephone Encounter (Signed)
Patient called to inform [facility] does not carry it. He stated to try [facility] on [address].  Please Advise, Thanks

## 2018-08-15 NOTE — Telephone Encounter (Signed)
He will start taking Jatenzo, 158 mg, 1 tablet with breakfast and dinnertime since this is a new prescription.  Remind him to come in the early afternoon to have his labs done for testosterone level next month instead of 9:30 AM

## 2018-08-16 ENCOUNTER — Ambulatory Visit (INDEPENDENT_AMBULATORY_CARE_PROVIDER_SITE_OTHER): Payer: BC Managed Care – PPO | Admitting: Physician Assistant

## 2018-08-16 ENCOUNTER — Other Ambulatory Visit: Payer: Self-pay | Admitting: Endocrinology

## 2018-08-16 ENCOUNTER — Other Ambulatory Visit: Payer: Self-pay

## 2018-08-16 ENCOUNTER — Encounter (INDEPENDENT_AMBULATORY_CARE_PROVIDER_SITE_OTHER): Payer: Self-pay | Admitting: Physician Assistant

## 2018-08-16 VITALS — BP 116/80 | HR 79 | Temp 98.3°F | Ht 71.0 in | Wt 255.0 lb

## 2018-08-16 DIAGNOSIS — E559 Vitamin D deficiency, unspecified: Secondary | ICD-10-CM | POA: Diagnosis not present

## 2018-08-16 DIAGNOSIS — E7849 Other hyperlipidemia: Secondary | ICD-10-CM

## 2018-08-16 DIAGNOSIS — Z9189 Other specified personal risk factors, not elsewhere classified: Secondary | ICD-10-CM

## 2018-08-16 DIAGNOSIS — Z6835 Body mass index (BMI) 35.0-35.9, adult: Secondary | ICD-10-CM

## 2018-08-16 MED ORDER — JATENZO 158 MG PO CAPS: 1.0000 | ORAL_CAPSULE | Freq: Two times a day (BID) | ORAL | 1 refills | Status: DC

## 2018-08-16 MED ORDER — VITAMIN D (ERGOCALCIFEROL) 1.25 MG (50000 UNIT) PO CAPS: 50000.0000 [IU] | ORAL_CAPSULE | ORAL | 0 refills | Status: DC

## 2018-08-16 NOTE — Telephone Encounter (Signed)
done
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
## 2018-08-16 NOTE — Telephone Encounter (Signed)
Please enter drugstore in his information

## 2018-08-28 ENCOUNTER — Other Ambulatory Visit: Payer: Self-pay | Admitting: Internal Medicine

## 2018-09-05 ENCOUNTER — Other Ambulatory Visit: Payer: Self-pay

## 2018-09-05 ENCOUNTER — Other Ambulatory Visit: Payer: BC Managed Care – PPO | Admitting: Internal Medicine

## 2018-09-05 DIAGNOSIS — R7989 Other specified abnormal findings of blood chemistry: Secondary | ICD-10-CM | POA: Diagnosis not present

## 2018-09-05 DIAGNOSIS — I10 Essential (primary) hypertension: Secondary | ICD-10-CM | POA: Diagnosis not present

## 2018-09-05 DIAGNOSIS — E7849 Other hyperlipidemia: Secondary | ICD-10-CM

## 2018-09-05 DIAGNOSIS — D649 Anemia, unspecified: Secondary | ICD-10-CM

## 2018-09-05 DIAGNOSIS — Z6835 Body mass index (BMI) 35.0-35.9, adult: Secondary | ICD-10-CM | POA: Diagnosis not present

## 2018-09-05 DIAGNOSIS — H4922 Sixth [abducent] nerve palsy, left eye: Secondary | ICD-10-CM

## 2018-09-05 DIAGNOSIS — E559 Vitamin D deficiency, unspecified: Secondary | ICD-10-CM

## 2018-09-05 LAB — COMPLETE METABOLIC PANEL WITH GFR
AG Ratio: 1.2 (calc) (ref 1.0–2.5)
ALT: 27 U/L (ref 9–46)
AST: 27 U/L (ref 10–40)
Albumin: 3.8 g/dL (ref 3.6–5.1)
Alkaline phosphatase (APISO): 35 U/L — ABNORMAL LOW (ref 36–130)
BUN: 18 mg/dL (ref 7–25)
CO2: 28 mmol/L (ref 20–32)
Calcium: 8.9 mg/dL (ref 8.6–10.3)
Chloride: 105 mmol/L (ref 98–110)
Creat: 0.82 mg/dL (ref 0.60–1.35)
GFR, Est African American: 121 mL/min/{1.73_m2} (ref >=60)
GFR, Est Non African American: 105 mL/min/{1.73_m2} (ref >=60)
Globulin: 3.1 g/dL (calc) (ref 1.9–3.7)
Glucose, Bld: 85 mg/dL (ref 65–99)
Potassium: 3.8 mmol/L (ref 3.5–5.3)
Sodium: 140 mmol/L (ref 135–146)
Total Bilirubin: 0.6 mg/dL (ref 0.2–1.2)
Total Protein: 6.9 g/dL (ref 6.1–8.1)

## 2018-09-05 LAB — CBC WITH DIFFERENTIAL/PLATELET
Absolute Monocytes: 412 cells/uL (ref 200–950)
Basophils Absolute: 29 cells/uL (ref 0–200)
Basophils Relative: 0.7 %
Eosinophils Absolute: 168 cells/uL (ref 15–500)
Eosinophils Relative: 4 %
HCT: 41.1 % (ref 38.5–50.0)
Hemoglobin: 14.1 g/dL (ref 13.2–17.1)
Lymphs Abs: 2369 cells/uL (ref 850–3900)
MCH: 31.3 pg (ref 27.0–33.0)
MCHC: 34.3 g/dL (ref 32.0–36.0)
MCV: 91.3 fL (ref 80.0–100.0)
MPV: 11.2 fL (ref 7.5–12.5)
Monocytes Relative: 9.8 %
Neutro Abs: 1222 cells/uL — ABNORMAL LOW (ref 1500–7800)
Neutrophils Relative %: 29.1 %
Platelets: 228 10*3/uL (ref 140–400)
RBC: 4.5 10*6/uL (ref 4.20–5.80)
RDW: 12.3 % (ref 11.0–15.0)
Total Lymphocyte: 56.4 %
WBC: 4.2 10*3/uL (ref 3.8–10.8)

## 2018-09-05 LAB — LIPID PANEL
Cholesterol: 138 mg/dL (ref <200)
HDL: 37 mg/dL — ABNORMAL LOW (ref >=40)
LDL Cholesterol (Calc): 81 mg/dL (calc)
Non-HDL Cholesterol (Calc): 101 mg/dL (calc) (ref <130)
Total CHOL/HDL Ratio: 3.7 (calc) (ref <5.0)
Triglycerides: 107 mg/dL (ref <150)

## 2018-09-06 ENCOUNTER — Ambulatory Visit (INDEPENDENT_AMBULATORY_CARE_PROVIDER_SITE_OTHER): Payer: BC Managed Care – PPO | Admitting: Physician Assistant

## 2018-09-06 ENCOUNTER — Other Ambulatory Visit: Payer: Self-pay

## 2018-09-06 ENCOUNTER — Encounter (INDEPENDENT_AMBULATORY_CARE_PROVIDER_SITE_OTHER): Payer: Self-pay | Admitting: Physician Assistant

## 2018-09-06 VITALS — BP 119/77 | HR 76 | Temp 98.6°F | Ht 71.0 in | Wt 261.0 lb

## 2018-09-06 DIAGNOSIS — Z6836 Body mass index (BMI) 36.0-36.9, adult: Secondary | ICD-10-CM

## 2018-09-06 DIAGNOSIS — K219 Gastro-esophageal reflux disease without esophagitis: Secondary | ICD-10-CM | POA: Diagnosis not present

## 2018-09-06 DIAGNOSIS — Z9189 Other specified personal risk factors, not elsewhere classified: Secondary | ICD-10-CM | POA: Diagnosis not present

## 2018-09-06 DIAGNOSIS — E559 Vitamin D deficiency, unspecified: Secondary | ICD-10-CM

## 2018-09-06 MED ORDER — VITAMIN D (ERGOCALCIFEROL) 1.25 MG (50000 UNIT) PO CAPS: 50000.0000 [IU] | ORAL_CAPSULE | ORAL | 0 refills | Status: DC
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
## 2018-09-08 ENCOUNTER — Ambulatory Visit (INDEPENDENT_AMBULATORY_CARE_PROVIDER_SITE_OTHER): Payer: BC Managed Care – PPO | Admitting: Internal Medicine

## 2018-09-08 ENCOUNTER — Other Ambulatory Visit: Payer: Self-pay

## 2018-09-08 ENCOUNTER — Encounter: Payer: Self-pay | Admitting: Internal Medicine

## 2018-09-08 VITALS — BP 110/80 | HR 78 | Temp 98.1°F | Ht 71.0 in | Wt 260.0 lb

## 2018-09-08 DIAGNOSIS — D899 Disorder involving the immune mechanism, unspecified: Secondary | ICD-10-CM | POA: Diagnosis not present

## 2018-09-08 DIAGNOSIS — D849 Immunodeficiency, unspecified: Secondary | ICD-10-CM

## 2018-09-08 DIAGNOSIS — D8689 Sarcoidosis of other sites: Secondary | ICD-10-CM

## 2018-09-08 DIAGNOSIS — Z0000 Encounter for general adult medical examination without abnormal findings: Secondary | ICD-10-CM | POA: Diagnosis not present

## 2018-09-08 DIAGNOSIS — R609 Edema, unspecified: Secondary | ICD-10-CM

## 2018-09-08 DIAGNOSIS — K219 Gastro-esophageal reflux disease without esophagitis: Secondary | ICD-10-CM

## 2018-09-08 DIAGNOSIS — M8589 Other specified disorders of bone density and structure, multiple sites: Secondary | ICD-10-CM

## 2018-09-08 DIAGNOSIS — I10 Essential (primary) hypertension: Secondary | ICD-10-CM

## 2018-09-08 DIAGNOSIS — Z6836 Body mass index (BMI) 36.0-36.9, adult: Secondary | ICD-10-CM

## 2018-09-08 DIAGNOSIS — H4922 Sixth [abducent] nerve palsy, left eye: Secondary | ICD-10-CM

## 2018-09-08 DIAGNOSIS — E786 Lipoprotein deficiency: Secondary | ICD-10-CM

## 2018-09-08 LAB — POCT URINALYSIS DIPSTICK
Appearance: NEGATIVE
Bilirubin, UA: NEGATIVE
Blood, UA: NEGATIVE
Glucose, UA: NEGATIVE
Ketones, UA: NEGATIVE
Leukocytes, UA: NEGATIVE
Nitrite, UA: NEGATIVE
Odor: NEGATIVE
Protein, UA: NEGATIVE
Spec Grav, UA: 1.015 (ref 1.010–1.025)
Urobilinogen, UA: 0.2 E.U./dL
pH, UA: 6 (ref 5.0–8.0)

## 2018-09-08 NOTE — Progress Notes (Signed)
   Subjective:    Patient ID: Adrian [name], male    DOB: 1970-10-09, 48 y.o.   MRN: [id number]  HPI  48 year old Male with history of neurosarcoidosis followed at [facility] in today for health maintenance exam.  He has been on Remicade 100 mg every other month IV at [facility].  Prednisone has been tapered.  History of hypertension.  No known drug allergies.  Still has double vision and wears eye patch left eye.  Has been seen at [facility].  Still has some issues with balance.  He fractured his left fourth and fifth fingers in 2014 due to a sports injury.  Left inguinal hernia repair 1998.  Remote history of periorbital fracture treated by Dr. Moshe Cipro.  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  He was hospitalized June 2018.  MRI showed multiple lesions that were contrast-enhancing scattered throughout the brain.  Lumbar puncture was performed.  ACE level was 55.  ANA was negative.  TSH was normal and sed rate was 2.  He was placed on high-dose steroids and gained a lot of weight.  Subsequently neurologist here placed him on Imuran before he began treatment at [facility] with Remicade.  He has not been able to return to work and is applying for disability.  Social history: He is divorced.  Formerly worked at [employer] as a Therapist, occupational for [place].  Does not smoke.  Quit smoking about 20 years ago.  He does use smokeless tobacco.  Does not consume beer.  Does not use illicit drugs.  Used to drink alcohol but quit.  Family history: He has 1 son and 1 daughter.  Father died at age 65 due to sepsis and AML complications.  Mother living with history of ovarian cancer.  One brother age 62 with history of obesity diabetes and history of MI.  One sister age 27 overweight.    Review of Systems see above     Objective:   Physical Exam Blood  pressure 110/80, pulse 78, weight 260 pounds, temperature 98.1 degrees orally BMI 36.26  Skin warm and dry.  Nodes none.  Left 6th nerve palsy noted.  TMs clear.  Pharynx clear.  No JVD thyromegaly or carotid bruits.  Chest clear to auscultation.  Cardiac exam regular rate and rhythm.  Abdomen no hepatosplenomegaly masses or tenderness.  He is alert and oriented x3.  Some ataxia.       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 Pearletha Forge.  Recently placed on Cortef 10 mg 2 times daily for 120 days.  OT recommended.  Issues with depth perception.  No recent falls.  He has had strabismus surgeries left eye  Obesity-seen at [facility] hypertension stable on current regimen.  Takes Lasix and carvedilol  History of low testosterone and recent testosterone level was 366.71.  He is on Jatenzo by Dr. Dwyane Dee  Osteopenia-treated with Actonel  History of vitamin D deficiency treated with vitamin D supplement  GE reflux treated with Protonix  Plan: His labs are reviewed and are stable at present time.  Return in 6 months or as needed.

## 2018-09-11 ENCOUNTER — Other Ambulatory Visit (INDEPENDENT_AMBULATORY_CARE_PROVIDER_SITE_OTHER): Payer: BC Managed Care – PPO

## 2018-09-11 ENCOUNTER — Other Ambulatory Visit: Payer: Self-pay

## 2018-09-11 ENCOUNTER — Other Ambulatory Visit: Payer: Self-pay | Admitting: Endocrinology

## 2018-09-11 DIAGNOSIS — E230 Hypopituitarism: Secondary | ICD-10-CM | POA: Diagnosis not present

## 2018-09-11 DIAGNOSIS — D8689 Sarcoidosis of other sites: Secondary | ICD-10-CM | POA: Diagnosis not present

## 2018-09-11 LAB — TESTOSTERONE: Testosterone: 366.71 ng/dL (ref 300.00–890.00)

## 2018-09-14 ENCOUNTER — Ambulatory Visit (INDEPENDENT_AMBULATORY_CARE_PROVIDER_SITE_OTHER): Payer: BC Managed Care – PPO | Admitting: Endocrinology

## 2018-09-14 ENCOUNTER — Other Ambulatory Visit: Payer: Self-pay

## 2018-09-14 ENCOUNTER — Encounter: Payer: Self-pay | Admitting: Endocrinology

## 2018-09-14 VITALS — BP 110/78 | HR 83 | Ht 71.0 in | Wt 264.6 lb

## 2018-09-14 DIAGNOSIS — M810 Age-related osteoporosis without current pathological fracture: Secondary | ICD-10-CM | POA: Diagnosis not present

## 2018-09-14 DIAGNOSIS — E230 Hypopituitarism: Secondary | ICD-10-CM | POA: Diagnosis not present
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
## 2018-09-25 ENCOUNTER — Other Ambulatory Visit: Payer: Self-pay

## 2018-09-25 ENCOUNTER — Encounter (INDEPENDENT_AMBULATORY_CARE_PROVIDER_SITE_OTHER): Payer: Self-pay | Admitting: Bariatrics

## 2018-09-25 ENCOUNTER — Ambulatory Visit (INDEPENDENT_AMBULATORY_CARE_PROVIDER_SITE_OTHER): Payer: BC Managed Care – PPO | Admitting: Bariatrics

## 2018-09-25 VITALS — BP 118/82 | HR 75 | Temp 98.0°F | Ht 71.0 in | Wt 261.0 lb

## 2018-09-25 DIAGNOSIS — I10 Essential (primary) hypertension: Secondary | ICD-10-CM | POA: Diagnosis not present

## 2018-09-25 DIAGNOSIS — Z6836 Body mass index (BMI) 36.0-36.9, adult: Secondary | ICD-10-CM

## 2018-09-25 DIAGNOSIS — R739 Hyperglycemia, unspecified: Secondary | ICD-10-CM | POA: Diagnosis not present

## 2018-09-25 DIAGNOSIS — Z9189 Other specified personal risk factors, not elsewhere classified: Secondary | ICD-10-CM | POA: Diagnosis not present

## 2018-09-25 DIAGNOSIS — E559 Vitamin D deficiency, unspecified: Secondary | ICD-10-CM | POA: Diagnosis not present

## 2018-09-25 MED ORDER — VITAMIN D (ERGOCALCIFEROL) 1.25 MG (50000 UNIT) PO CAPS: 50000.0000 [IU] | ORAL_CAPSULE | ORAL | 0 refills | Status: DC

## 2018-09-26 LAB — INSULIN, RANDOM: INSULIN: 11.5 u[IU]/mL (ref 2.6–24.9)

## 2018-09-26 LAB — VITAMIN D 25 HYDROXY (VIT D DEFICIENCY, FRACTURES): Vit D, 25-Hydroxy: 63.6 ng/mL (ref 30.0–100.0)

## 2018-09-26 LAB — HEMOGLOBIN A1C
Est. average glucose Bld gHb Est-mCnc: 105 mg/dL
Hgb A1c MFr Bld: 5.3 % (ref 4.8–5.6)

## 2018-09-27 ENCOUNTER — Ambulatory Visit (INDEPENDENT_AMBULATORY_CARE_PROVIDER_SITE_OTHER): Payer: BC Managed Care – PPO | Admitting: Physician Assistant

## 2018-09-27 DIAGNOSIS — D869 Sarcoidosis, unspecified: Secondary | ICD-10-CM | POA: Diagnosis not present
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
## 2018-09-28 ENCOUNTER — Encounter (INDEPENDENT_AMBULATORY_CARE_PROVIDER_SITE_OTHER): Payer: Self-pay | Admitting: Bariatrics

## 2018-10-02 NOTE — Patient Instructions (Addendum)
It was a pleasure to see you today.  Continue current medications.  Return in 6 months or as needed.  Have annual flu vaccine.  Stay safe.  Wear a mask and socially distance herself due to immunocompromised state

## 2018-10-16 ENCOUNTER — Other Ambulatory Visit: Payer: Self-pay

## 2018-10-16 ENCOUNTER — Telehealth (INDEPENDENT_AMBULATORY_CARE_PROVIDER_SITE_OTHER): Payer: BC Managed Care – PPO | Admitting: Physician Assistant

## 2018-10-16 ENCOUNTER — Encounter (INDEPENDENT_AMBULATORY_CARE_PROVIDER_SITE_OTHER): Payer: Self-pay | Admitting: Physician Assistant

## 2018-10-16 DIAGNOSIS — E559 Vitamin D deficiency, unspecified: Secondary | ICD-10-CM | POA: Diagnosis not present

## 2018-10-16 DIAGNOSIS — Z6836 Body mass index (BMI) 36.0-36.9, adult: Secondary | ICD-10-CM | POA: Diagnosis not present

## 2018-10-16 DIAGNOSIS — E8881 Metabolic syndrome: Secondary | ICD-10-CM

## 2018-10-16 MED ORDER — VITAMIN D (ERGOCALCIFEROL) 1.25 MG (50000 UNIT) PO CAPS: 50000.0000 [IU] | ORAL_CAPSULE | ORAL | 0 refills | Status: DC
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
## 2018-10-23 ENCOUNTER — Telehealth: Payer: Self-pay | Admitting: Endocrinology

## 2018-10-23 ENCOUNTER — Other Ambulatory Visit: Payer: Self-pay | Admitting: Internal Medicine

## 2018-10-23 NOTE — Telephone Encounter (Signed)
Request has already been sent to the MD. He will address.

## 2018-10-23 NOTE — Telephone Encounter (Signed)
MEDICATION: [place]  PHARMACY:  [facility] on [address]   IS THIS A 90 DAY SUPPLY :   IS PATIENT OUT OF MEDICATION: no  IF NOT; HOW MUCH IS LEFT:  2-3 days  LAST APPOINTMENT DATE: @8 /13/2020  NEXT APPOINTMENT DATE:@12 /14/2020  DO WE HAVE YOUR PERMISSION TO LEAVE A DETAILED MESSAGE: yes [phone]  OTHER COMMENTS:    **Let patient know to contact pharmacy at the end of the day to make sure medication is ready. **  ** Please notify patient to allow 48-72 hours to process**  **Encourage patient to contact the pharmacy for refills or they can request refills through [facility]**

## 2018-10-24 ENCOUNTER — Other Ambulatory Visit: Payer: Self-pay | Admitting: Endocrinology

## 2018-10-24 MED ORDER — JATENZO 158 MG PO CAPS: 1.0000 | ORAL_CAPSULE | Freq: Two times a day (BID) | ORAL | 3 refills | Status: DC

## 2018-10-24 NOTE — Telephone Encounter (Signed)
Called pt and left detailed message letting pt know that the prescription has been sent in.

## 2018-10-24 NOTE — Telephone Encounter (Signed)
Has this been completed? Pt is calling again to inquire about it.

## 2018-10-24 NOTE — Telephone Encounter (Signed)
Not yet, I have just received the message and will do so

## 2018-10-30 ENCOUNTER — Encounter: Payer: Self-pay | Admitting: Physician Assistant

## 2018-10-30 ENCOUNTER — Encounter (INDEPENDENT_AMBULATORY_CARE_PROVIDER_SITE_OTHER): Payer: Self-pay | Admitting: Physician Assistant

## 2018-10-30 ENCOUNTER — Ambulatory Visit (INDEPENDENT_AMBULATORY_CARE_PROVIDER_SITE_OTHER): Payer: BC Managed Care – PPO | Admitting: Physician Assistant

## 2018-10-30 ENCOUNTER — Other Ambulatory Visit: Payer: Self-pay

## 2018-10-30 VITALS — BP 104/75 | HR 70 | Temp 98.3°F | Ht 71.0 in | Wt 261.0 lb

## 2018-10-30 DIAGNOSIS — Z6836 Body mass index (BMI) 36.0-36.9, adult: Secondary | ICD-10-CM

## 2018-10-30 DIAGNOSIS — Z9189 Other specified personal risk factors, not elsewhere classified: Secondary | ICD-10-CM | POA: Diagnosis not present

## 2018-10-30 DIAGNOSIS — E559 Vitamin D deficiency, unspecified: Secondary | ICD-10-CM | POA: Diagnosis not present

## 2018-10-30 DIAGNOSIS — R0602 Shortness of breath: Secondary | ICD-10-CM | POA: Diagnosis not present
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
## 2018-11-06 ENCOUNTER — Other Ambulatory Visit: Payer: Self-pay | Admitting: Internal Medicine

## 2018-11-06 DIAGNOSIS — D869 Sarcoidosis, unspecified: Secondary | ICD-10-CM | POA: Diagnosis not present

## 2018-11-06 DIAGNOSIS — I10 Essential (primary) hypertension: Secondary | ICD-10-CM

## 2018-11-13 ENCOUNTER — Other Ambulatory Visit (INDEPENDENT_AMBULATORY_CARE_PROVIDER_SITE_OTHER): Payer: Self-pay

## 2018-11-13 DIAGNOSIS — E559 Vitamin D deficiency, unspecified: Secondary | ICD-10-CM

## 2018-11-13 MED ORDER — VITAMIN D (ERGOCALCIFEROL) 1.25 MG (50000 UNIT) PO CAPS: 50000.0000 [IU] | ORAL_CAPSULE | ORAL | 0 refills | Status: DC

## 2018-11-21 ENCOUNTER — Other Ambulatory Visit: Payer: Self-pay

## 2018-11-21 ENCOUNTER — Ambulatory Visit (INDEPENDENT_AMBULATORY_CARE_PROVIDER_SITE_OTHER): Payer: BC Managed Care – PPO | Admitting: Physician Assistant

## 2018-11-21 VITALS — BP 119/83 | HR 69 | Temp 98.6°F | Ht 71.0 in | Wt 263.0 lb

## 2018-11-21 DIAGNOSIS — Z9189 Other specified personal risk factors, not elsewhere classified: Secondary | ICD-10-CM | POA: Diagnosis not present

## 2018-11-21 DIAGNOSIS — Z6836 Body mass index (BMI) 36.0-36.9, adult: Secondary | ICD-10-CM

## 2018-11-21 DIAGNOSIS — E8881 Metabolic syndrome: Secondary | ICD-10-CM | POA: Diagnosis not present

## 2018-11-21 DIAGNOSIS — E559 Vitamin D deficiency, unspecified: Secondary | ICD-10-CM | POA: Diagnosis not present

## 2018-11-21 MED ORDER — VITAMIN D (ERGOCALCIFEROL) 1.25 MG (50000 UNIT) PO CAPS: 50000.0000 [IU] | ORAL_CAPSULE | ORAL | 0 refills | Status: DC
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
## 2018-11-29 DIAGNOSIS — H50012 Monocular esotropia, left eye: Secondary | ICD-10-CM | POA: Diagnosis not present

## 2018-11-29 DIAGNOSIS — H4922 Sixth [abducent] nerve palsy, left eye: Secondary | ICD-10-CM | POA: Diagnosis not present

## 2018-12-06 ENCOUNTER — Encounter (INDEPENDENT_AMBULATORY_CARE_PROVIDER_SITE_OTHER): Payer: Self-pay | Admitting: Physician Assistant

## 2018-12-12 ENCOUNTER — Ambulatory Visit (INDEPENDENT_AMBULATORY_CARE_PROVIDER_SITE_OTHER): Payer: BC Managed Care – PPO | Admitting: Physician Assistant

## 2018-12-12 ENCOUNTER — Other Ambulatory Visit: Payer: Self-pay

## 2018-12-12 ENCOUNTER — Encounter (INDEPENDENT_AMBULATORY_CARE_PROVIDER_SITE_OTHER): Payer: Self-pay | Admitting: Physician Assistant

## 2018-12-12 VITALS — BP 111/73 | HR 64 | Temp 98.5°F | Ht 71.0 in | Wt 263.0 lb

## 2018-12-12 DIAGNOSIS — Z6836 Body mass index (BMI) 36.0-36.9, adult: Secondary | ICD-10-CM

## 2018-12-12 DIAGNOSIS — E559 Vitamin D deficiency, unspecified: Secondary | ICD-10-CM

## 2018-12-12 DIAGNOSIS — E7849 Other hyperlipidemia: Secondary | ICD-10-CM

## 2018-12-12 DIAGNOSIS — E66812 Obesity, class 2: Secondary | ICD-10-CM

## 2018-12-12 DIAGNOSIS — Z9189 Other specified personal risk factors, not elsewhere classified: Secondary | ICD-10-CM

## 2018-12-12 MED ORDER — VITAMIN D (ERGOCALCIFEROL) 1.25 MG (50000 UNIT) PO CAPS: 50000.0000 [IU] | ORAL_CAPSULE | ORAL | 0 refills | Status: DC
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
## 2018-12-21 DIAGNOSIS — D869 Sarcoidosis, unspecified: Secondary | ICD-10-CM | POA: Diagnosis not present

## 2019-01-02 ENCOUNTER — Ambulatory Visit (INDEPENDENT_AMBULATORY_CARE_PROVIDER_SITE_OTHER): Payer: BC Managed Care – PPO | Admitting: Physician Assistant

## 2019-01-02 ENCOUNTER — Encounter (INDEPENDENT_AMBULATORY_CARE_PROVIDER_SITE_OTHER): Payer: Self-pay | Admitting: Physician Assistant

## 2019-01-02 ENCOUNTER — Other Ambulatory Visit: Payer: Self-pay

## 2019-01-02 VITALS — BP 118/76 | HR 72 | Temp 98.4°F | Ht 71.0 in | Wt 261.0 lb

## 2019-01-02 DIAGNOSIS — Z6836 Body mass index (BMI) 36.0-36.9, adult: Secondary | ICD-10-CM

## 2019-01-02 DIAGNOSIS — Z9189 Other specified personal risk factors, not elsewhere classified: Secondary | ICD-10-CM

## 2019-01-02 DIAGNOSIS — E559 Vitamin D deficiency, unspecified: Secondary | ICD-10-CM | POA: Diagnosis not present

## 2019-01-02 DIAGNOSIS — E7849 Other hyperlipidemia: Secondary | ICD-10-CM

## 2019-01-02 MED ORDER — VITAMIN D (ERGOCALCIFEROL) 1.25 MG (50000 UNIT) PO CAPS: 50000.0000 [IU] | ORAL_CAPSULE | ORAL | 0 refills | Status: DC
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
## 2019-01-10 ENCOUNTER — Other Ambulatory Visit (INDEPENDENT_AMBULATORY_CARE_PROVIDER_SITE_OTHER): Payer: BC Managed Care – PPO

## 2019-01-10 ENCOUNTER — Other Ambulatory Visit: Payer: Self-pay

## 2019-01-10 DIAGNOSIS — M810 Age-related osteoporosis without current pathological fracture: Secondary | ICD-10-CM | POA: Diagnosis not present

## 2019-01-10 DIAGNOSIS — E230 Hypopituitarism: Secondary | ICD-10-CM | POA: Diagnosis not present

## 2019-01-10 LAB — CBC
HCT: 46.3 % (ref 39.0–52.0)
Hemoglobin: 15.5 g/dL (ref 13.0–17.0)
MCHC: 33.5 g/dL (ref 30.0–36.0)
MCV: 93 fl (ref 78.0–100.0)
Platelets: 221 10*3/uL (ref 150.0–400.0)
RBC: 4.98 Mil/uL (ref 4.22–5.81)
RDW: 12.4 % (ref 11.5–15.5)
WBC: 5.2 10*3/uL (ref 4.0–10.5)

## 2019-01-10 LAB — VITAMIN D 25 HYDROXY (VIT D DEFICIENCY, FRACTURES): VITD: 49.61 ng/mL (ref 30.00–100.00)

## 2019-01-10 LAB — TESTOSTERONE: Testosterone: 113.76 ng/dL — ABNORMAL LOW (ref 300.00–890.00)

## 2019-01-15 ENCOUNTER — Other Ambulatory Visit: Payer: Self-pay

## 2019-01-15 ENCOUNTER — Encounter: Payer: Self-pay | Admitting: Endocrinology

## 2019-01-15 ENCOUNTER — Other Ambulatory Visit: Payer: BC Managed Care – PPO

## 2019-01-15 ENCOUNTER — Ambulatory Visit (INDEPENDENT_AMBULATORY_CARE_PROVIDER_SITE_OTHER): Payer: BC Managed Care – PPO | Admitting: Endocrinology

## 2019-01-15 DIAGNOSIS — Z20828 Contact with and (suspected) exposure to other viral communicable diseases: Secondary | ICD-10-CM | POA: Diagnosis not present

## 2019-01-15 DIAGNOSIS — M810 Age-related osteoporosis without current pathological fracture: Secondary | ICD-10-CM

## 2019-01-15 DIAGNOSIS — U071 COVID-19: Secondary | ICD-10-CM | POA: Diagnosis not present

## 2019-01-15 DIAGNOSIS — E291 Testicular hypofunction: Secondary | ICD-10-CM

## 2019-01-15 DIAGNOSIS — H4922 Sixth [abducent] nerve palsy, left eye: Secondary | ICD-10-CM | POA: Diagnosis not present
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
## 2019-01-18 ENCOUNTER — Ambulatory Visit: Payer: BC Managed Care – PPO | Admitting: Endocrinology

## 2019-01-23 ENCOUNTER — Ambulatory Visit (INDEPENDENT_AMBULATORY_CARE_PROVIDER_SITE_OTHER): Payer: BC Managed Care – PPO | Admitting: Physician Assistant

## 2019-01-23 ENCOUNTER — Other Ambulatory Visit: Payer: Self-pay

## 2019-01-23 ENCOUNTER — Encounter (INDEPENDENT_AMBULATORY_CARE_PROVIDER_SITE_OTHER): Payer: Self-pay | Admitting: Physician Assistant

## 2019-01-23 DIAGNOSIS — Z6836 Body mass index (BMI) 36.0-36.9, adult: Secondary | ICD-10-CM | POA: Diagnosis not present

## 2019-01-23 DIAGNOSIS — E559 Vitamin D deficiency, unspecified: Secondary | ICD-10-CM | POA: Diagnosis not present

## 2019-01-23 MED ORDER — VITAMIN D (ERGOCALCIFEROL) 1.25 MG (50000 UNIT) PO CAPS: 50000.0000 [IU] | ORAL_CAPSULE | ORAL | 0 refills | Status: DC
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
## 2019-02-07 DIAGNOSIS — H532 Diplopia: Secondary | ICD-10-CM | POA: Insufficient documentation

## 2019-02-27 ENCOUNTER — Other Ambulatory Visit: Payer: Self-pay | Admitting: Internal Medicine

## 2019-03-01 ENCOUNTER — Telehealth: Payer: Self-pay

## 2019-03-01 ENCOUNTER — Other Ambulatory Visit: Payer: Self-pay

## 2019-03-01 ENCOUNTER — Other Ambulatory Visit (INDEPENDENT_AMBULATORY_CARE_PROVIDER_SITE_OTHER): Payer: 59

## 2019-03-01 DIAGNOSIS — E291 Testicular hypofunction: Secondary | ICD-10-CM

## 2019-03-01 LAB — TESTOSTERONE: Testosterone: 98.6 ng/dL — ABNORMAL LOW (ref 300.00–890.00)

## 2019-03-01 NOTE — Telephone Encounter (Signed)
MEDICATION: jatenzo  PHARMACY:  [facility] [address]  IS THIS A 90 DAY SUPPLY : yes  IS PATIENT OUT OF MEDICATION: no  IF NOT; HOW MUCH IS LEFT: few days worth  LAST APPOINTMENT DATE: @12 /14/2020  NEXT APPOINTMENT DATE:@Visit  date not found  DO WE HAVE YOUR PERMISSION TO LEAVE A DETAILED MESSAGE:yes  OTHER COMMENTS:    **Let patient know to contact pharmacy at the end of the day to make sure medication is ready. **  ** Please notify patient to allow 48-72 hours to process**  **Encourage patient to contact the pharmacy for refills or they can request refills through [facility]**

## 2019-03-01 NOTE — Telephone Encounter (Signed)
If he can wait till after his lab work on 2/4 to refill that would be preferable because I may need to change the dose.  Or he can come for lab work earlier.  Needs to be having labs in the early afternoon instead of morning.  Also will need a follow-up visit in April with labs also

## 2019-03-01 NOTE — Telephone Encounter (Signed)
Called pt and gave him MD message. Pt verbalized understanding and he stated that he was in this part of town right now, and he could be here within the hour to have his blood work done. Pt was moved to today's schedule and informed that when he arrives, he should also schedule a visit with labs in April.

## 2019-03-01 NOTE — Telephone Encounter (Signed)
Ok to refill 

## 2019-03-05 ENCOUNTER — Other Ambulatory Visit: Payer: Self-pay | Admitting: Endocrinology

## 2019-03-05 MED ORDER — JATENZO 237 MG PO CAPS: 1.0000 | ORAL_CAPSULE | Freq: Two times a day (BID) | ORAL | 1 refills | Status: DC

## 2019-03-08 ENCOUNTER — Other Ambulatory Visit: Payer: BC Managed Care – PPO

## 2019-03-14 ENCOUNTER — Other Ambulatory Visit: Payer: Self-pay | Admitting: Endocrinology

## 2019-03-14 ENCOUNTER — Telehealth: Payer: Self-pay

## 2019-03-14 MED ORDER — TESTOSTERONE 20.25 MG/ACT (1.62%) TD GEL: TRANSDERMAL | 1 refills | Status: DC

## 2019-03-14 NOTE — Telephone Encounter (Signed)
PA initiated via [email or web address] for Testosterone 1.62% gel.  Earl Libbey Key: [id number] - PA Case ID: [id number] - Rx #: [id number] Need help? Call us at [phone] Status Sent to [employer] Testosterone 20.25 MG/ACT(1.62%) gel Form [employer] 4-Part N[employer] Electronic PA Form Original Claim Info 75 Prior Authorization Required Leman Clodfelter (Key: [id number])  [employer] has received your information, and the request will be reviewed. You may close this dialog, return to your dashboard, and perform other tasks.  You will receive an electronic determination in CoverMyMeds. You can see the latest determination by locating this request on your dashboard or by reopening this request. You will also receive a faxed copy of the determination. If you have any questions please contact [employer] at [phone].  If you need assistance, please chat with CoverMyMeds or call us at [phone].

## 2019-03-15 NOTE — Telephone Encounter (Signed)
Received fax from [employer] stating that pt has been approved for Testosterone 1.62% gel.  This request has been approved as of 03/14/19 and is good through 09/09/19.

## 2019-03-22 ENCOUNTER — Telehealth: Payer: Self-pay | Admitting: Endocrinology

## 2019-03-22 NOTE — Telephone Encounter (Signed)
-----   Message from Cherylann Parr sent at 03/22/2019 12:01 PM EST ----- Regarding: FW: Change appointment LMTCB to schedule labs, also sent mychart message. ----- Message ----- From: Reather Littler, MD Sent: 03/12/2019  10:01 AM EST To: [facility] Admin Pool Subject: Change appointment                             Since his medication was changed he needs to be seen in third week of March with labs before visit

## 2019-03-27 ENCOUNTER — Other Ambulatory Visit: Payer: Self-pay | Admitting: Endocrinology

## 2019-03-27 ENCOUNTER — Telehealth: Payer: Self-pay

## 2019-03-27 MED ORDER — TESTOSTERONE 20.25 MG/ACT (1.62%) TD GEL: TRANSDERMAL | 1 refills | Status: DC

## 2019-03-27 NOTE — Telephone Encounter (Signed)
AndroGel was sent on 2/10.  Is he taking Jatenzo?

## 2019-03-27 NOTE — Telephone Encounter (Signed)
PT is [facility] [place] [facility].

## 2019-03-27 NOTE — Telephone Encounter (Signed)
He is not taking Jatenzo. Refill for testosterone 1.62% was not sent to pharmacy listed below. The pharmacy that it was sent to cannot get this medication for the patient.

## 2019-04-17 ENCOUNTER — Other Ambulatory Visit: Payer: Self-pay | Admitting: Endocrinology

## 2019-04-17 DIAGNOSIS — E291 Testicular hypofunction: Secondary | ICD-10-CM

## 2019-04-20 ENCOUNTER — Other Ambulatory Visit (INDEPENDENT_AMBULATORY_CARE_PROVIDER_SITE_OTHER): Payer: 59

## 2019-04-20 ENCOUNTER — Other Ambulatory Visit: Payer: Self-pay

## 2019-04-20 DIAGNOSIS — E291 Testicular hypofunction: Secondary | ICD-10-CM

## 2019-04-20 LAB — CBC
HCT: 43.8 % (ref 39.0–52.0)
Hemoglobin: 15.2 g/dL (ref 13.0–17.0)
MCHC: 34.8 g/dL (ref 30.0–36.0)
MCV: 91.7 fl (ref 78.0–100.0)
Platelets: 239 10*3/uL (ref 150.0–400.0)
RBC: 4.77 Mil/uL (ref 4.22–5.81)
RDW: 13.1 % (ref 11.5–15.5)
WBC: 5.1 10*3/uL (ref 4.0–10.5)

## 2019-04-20 LAB — TESTOSTERONE: Testosterone: 281.34 ng/dL — ABNORMAL LOW (ref 300.00–890.00)

## 2019-04-23 ENCOUNTER — Other Ambulatory Visit: Payer: Self-pay | Admitting: Internal Medicine

## 2019-04-24 ENCOUNTER — Encounter: Payer: Self-pay | Admitting: Endocrinology

## 2019-04-24 ENCOUNTER — Ambulatory Visit (INDEPENDENT_AMBULATORY_CARE_PROVIDER_SITE_OTHER): Payer: 59 | Admitting: Endocrinology

## 2019-04-24 ENCOUNTER — Other Ambulatory Visit: Payer: Self-pay

## 2019-04-24 VITALS — Ht 71.0 in

## 2019-04-24 DIAGNOSIS — M810 Age-related osteoporosis without current pathological fracture: Secondary | ICD-10-CM | POA: Diagnosis not present

## 2019-04-24 DIAGNOSIS — E291 Testicular hypofunction: Secondary | ICD-10-CM

## 2019-04-24 MED ORDER — TESTOSTERONE 20.25 MG/ACT (1.62%) TD GEL: TRANSDERMAL | 1 refills | Status: DC
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
## 2019-04-24 NOTE — Telephone Encounter (Signed)
He is past due for 6 month recheck

## 2019-05-01 ENCOUNTER — Other Ambulatory Visit: Payer: Self-pay

## 2019-05-01 ENCOUNTER — Other Ambulatory Visit: Payer: 59 | Admitting: Internal Medicine

## 2019-05-01 DIAGNOSIS — Z5181 Encounter for therapeutic drug level monitoring: Secondary | ICD-10-CM

## 2019-05-01 DIAGNOSIS — E8881 Metabolic syndrome: Secondary | ICD-10-CM

## 2019-05-02 ENCOUNTER — Ambulatory Visit (INDEPENDENT_AMBULATORY_CARE_PROVIDER_SITE_OTHER)
Admission: RE | Admit: 2019-05-02 | Discharge: 2019-05-02 | Disposition: A | Discharge status: Home / Self Care | Payer: 59 | Source: Ambulatory Visit | Attending: Endocrinology | Admitting: Endocrinology

## 2019-05-02 DIAGNOSIS — M810 Age-related osteoporosis without current pathological fracture: Secondary | ICD-10-CM

## 2019-05-02 LAB — BASIC METABOLIC PANEL
BUN: 18 mg/dL (ref 7–25)
CO2: 27 mmol/L (ref 20–32)
Calcium: 8.8 mg/dL (ref 8.6–10.3)
Chloride: 104 mmol/L (ref 98–110)
Creat: 0.85 mg/dL (ref 0.60–1.35)
Glucose, Bld: 82 mg/dL (ref 65–99)
Potassium: 3.9 mmol/L (ref 3.5–5.3)
Sodium: 140 mmol/L (ref 135–146)

## 2019-05-02 LAB — HEMOGLOBIN A1C
Hgb A1c MFr Bld: 5.3 % of total Hgb (ref <5.7)
Mean Plasma Glucose: 105 (calc)
eAG (mmol/L): 5.8 (calc)

## 2019-05-10 ENCOUNTER — Encounter: Payer: Self-pay | Admitting: Internal Medicine

## 2019-05-10 ENCOUNTER — Ambulatory Visit (INDEPENDENT_AMBULATORY_CARE_PROVIDER_SITE_OTHER): Payer: 59 | Admitting: Internal Medicine

## 2019-05-10 ENCOUNTER — Other Ambulatory Visit: Payer: Self-pay

## 2019-05-10 VITALS — BP 120/88 | HR 72 | Temp 98.2°F | Ht 71.0 in | Wt 262.0 lb

## 2019-05-10 DIAGNOSIS — K219 Gastro-esophageal reflux disease without esophagitis: Secondary | ICD-10-CM | POA: Diagnosis not present

## 2019-05-10 DIAGNOSIS — Z6836 Body mass index (BMI) 36.0-36.9, adult: Secondary | ICD-10-CM

## 2019-05-10 DIAGNOSIS — I10 Essential (primary) hypertension: Secondary | ICD-10-CM

## 2019-05-10 DIAGNOSIS — R7989 Other specified abnormal findings of blood chemistry: Secondary | ICD-10-CM

## 2019-05-10 DIAGNOSIS — E559 Vitamin D deficiency, unspecified: Secondary | ICD-10-CM | POA: Diagnosis not present

## 2019-05-10 DIAGNOSIS — R609 Edema, unspecified: Secondary | ICD-10-CM

## 2019-05-10 DIAGNOSIS — D8689 Sarcoidosis of other sites: Secondary | ICD-10-CM

## 2019-05-10 DIAGNOSIS — M8589 Other specified disorders of bone density and structure, multiple sites: Secondary | ICD-10-CM

## 2019-05-10 NOTE — Progress Notes (Signed)
   Subjective:    Patient ID: Adrian [name], male    DOB: 27-Aug-1970, 49 y.o.   MRN: [id number]  HPI 49 year old Male for 55-month follow-up.  He has a history of neurosarcoidosis and is followed at [facility].  Continues to receive regular monoclonal antibody infusion at [facility] consisting of Remicade.   Sees Alois Cliche, PA for weight loss at [facility] Dr. Lorina Rabon, ophthalmologist, moving from [place] to [place].  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  As part of his evaluation for osteoporosis due to steroid therapy, he was found to have hypogonadism with LH 7.5 which is the upper limits of normal and mildly increased prolactin along with a free testosterone that was low at 3.6.  He was started on testosterone supplementation per Dr. Lucianne Muss.  Insurance did not cover testing on but he was able to get [employer] Sailors in July 2020 and prior to starting testosterone his level was 282.  Now he is on AndroGel.  Testosterone still remains below 302 81.34 when checked March 19.  He was treated with prednisone from March 2018 at high doses around 80 mg daily and has tapered down to 15 mg.  Had density study 2018 showing LS spine T score -2.1.  Femoral neck T score was -1.0.  He was started on Actonel in December 2018.  He also takes vitamin D 50,000 units twice a month and calcium supplement.  He does not smoke but uses smokeless tobacco.  He quit smoking in March 2000.  He is unable to work and is trying to get disability benefits.  Continues with Protonix for GE reflux.    He has a history of hypertension treated with Lasix and Coreg.  He really has not lost any weight over the past several months but has been able to maintain current weight.  Review of Systems recent bone density study March 2021     Objective:   Physical Exam Blood pressure 120/88 weight 262 pounds BMI 36.54 pulse oximetry 93% pulse is 72 and  regular  Skin warm and dry.  Neck is supple without JVD thyromegaly or carotid bruits.  Chest clear to auscultation.  Cardiac exam regular rate and rhythm normal S1 and S2.  Trace pitting edema of the lower extremities.       Assessment & Plan:  BMI 36.54-continue with weight loss efforts  Neurosarcoidosis-continues with Remicade infusions at [facility].  Left 6th nerve palsy improved status post surgery there.  Osteopenia-still on steroids and needs to be followed.  He sees Dr. Lucianne Muss.  Hypogonadism treated with testosterone supplementation by Dr. Lucianne Muss  Essential hypertension-stable on current regimen  Plan: He will need to return in 6 months for health maintenance exam and fasting labs.  No change in medications.

## 2019-05-15 ENCOUNTER — Other Ambulatory Visit: Payer: Self-pay | Admitting: Internal Medicine

## 2019-05-15 DIAGNOSIS — I10 Essential (primary) hypertension: Secondary | ICD-10-CM

## 2019-05-27 NOTE — Patient Instructions (Addendum)
It was a pleasure to see you today.  Continue current medications and follow-up for health maintenance exam and fasting labs in 6 months.  Please continue diet exercise and weight loss efforts.

## 2019-05-31 ENCOUNTER — Ambulatory Visit: Payer: 59 | Admitting: Endocrinology

## 2019-06-21 ENCOUNTER — Other Ambulatory Visit: Payer: Self-pay | Admitting: Endocrinology

## 2019-06-30 ENCOUNTER — Other Ambulatory Visit: Payer: Self-pay | Admitting: Endocrinology

## 2019-07-03 NOTE — Telephone Encounter (Signed)
Rx will be faxed to pharmacy

## 2019-07-03 NOTE — Telephone Encounter (Signed)
E prescribe not available, prescription has been signed

## 2019-07-03 NOTE — Telephone Encounter (Signed)
Please refill if appropriate

## 2019-07-05 ENCOUNTER — Telehealth: Payer: Self-pay

## 2019-07-05 NOTE — Telephone Encounter (Signed)
FAXED DOCUMENTS  Company[facility] [address] (faxed on 07/03/19) Document: Testosterone prescription Other records requested: none at this time.  All above requested information has been faxed successfully to [employer] listed above. Documents and fax confirmation have been placed in the faxed file for future reference.

## 2019-07-25 ENCOUNTER — Other Ambulatory Visit: Payer: Self-pay | Admitting: Internal Medicine

## 2019-07-27 ENCOUNTER — Other Ambulatory Visit: Payer: Self-pay

## 2019-07-27 ENCOUNTER — Other Ambulatory Visit (INDEPENDENT_AMBULATORY_CARE_PROVIDER_SITE_OTHER): Payer: 59

## 2019-07-27 DIAGNOSIS — E291 Testicular hypofunction: Secondary | ICD-10-CM

## 2019-07-27 DIAGNOSIS — M810 Age-related osteoporosis without current pathological fracture: Secondary | ICD-10-CM

## 2019-07-27 LAB — CBC
HCT: 47 % (ref 39.0–52.0)
Hemoglobin: 15.9 g/dL (ref 13.0–17.0)
MCHC: 33.9 g/dL (ref 30.0–36.0)
MCV: 93 fl (ref 78.0–100.0)
Platelets: 243 10*3/uL (ref 150.0–400.0)
RBC: 5.05 Mil/uL (ref 4.22–5.81)
RDW: 12.6 % (ref 11.5–15.5)
WBC: 4.9 10*3/uL (ref 4.0–10.5)

## 2019-07-27 LAB — TESTOSTERONE: Testosterone: 634.02 ng/dL (ref 300.00–890.00)

## 2019-07-27 LAB — VITAMIN D 25 HYDROXY (VIT D DEFICIENCY, FRACTURES): VITD: 40.8 ng/mL (ref 30.00–100.00)

## 2019-07-31 ENCOUNTER — Other Ambulatory Visit: Payer: Self-pay

## 2019-07-31 ENCOUNTER — Ambulatory Visit: Payer: 59 | Admitting: Endocrinology

## 2019-07-31 ENCOUNTER — Ambulatory Visit (INDEPENDENT_AMBULATORY_CARE_PROVIDER_SITE_OTHER): Payer: 59 | Admitting: Endocrinology

## 2019-07-31 ENCOUNTER — Encounter: Payer: Self-pay | Admitting: Endocrinology

## 2019-07-31 VITALS — BP 130/78 | HR 86 | Ht 71.0 in | Wt 287.4 lb

## 2019-07-31 DIAGNOSIS — E291 Testicular hypofunction: Secondary | ICD-10-CM

## 2019-07-31 DIAGNOSIS — M810 Age-related osteoporosis without current pathological fracture: Secondary | ICD-10-CM
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
## 2019-08-30 ENCOUNTER — Other Ambulatory Visit: Payer: Self-pay | Admitting: Internal Medicine

## 2019-09-06 ENCOUNTER — Other Ambulatory Visit: Payer: Self-pay

## 2019-09-06 ENCOUNTER — Other Ambulatory Visit: Payer: 59 | Admitting: Internal Medicine

## 2019-09-06 DIAGNOSIS — R739 Hyperglycemia, unspecified: Secondary | ICD-10-CM

## 2019-09-06 DIAGNOSIS — I10 Essential (primary) hypertension: Secondary | ICD-10-CM

## 2019-09-06 DIAGNOSIS — D849 Immunodeficiency, unspecified: Secondary | ICD-10-CM

## 2019-09-06 DIAGNOSIS — M8589 Other specified disorders of bone density and structure, multiple sites: Secondary | ICD-10-CM

## 2019-09-06 DIAGNOSIS — E786 Lipoprotein deficiency: Secondary | ICD-10-CM

## 2019-09-06 DIAGNOSIS — Z0000 Encounter for general adult medical examination without abnormal findings: Secondary | ICD-10-CM

## 2019-09-06 DIAGNOSIS — Z6836 Body mass index (BMI) 36.0-36.9, adult: Secondary | ICD-10-CM

## 2019-09-06 DIAGNOSIS — E8881 Metabolic syndrome: Secondary | ICD-10-CM

## 2019-09-07 LAB — COMPLETE METABOLIC PANEL WITH GFR
AG Ratio: 1.4 (calc) (ref 1.0–2.5)
ALT: 24 U/L (ref 9–46)
AST: 21 U/L (ref 10–40)
Albumin: 4.1 g/dL (ref 3.6–5.1)
Alkaline phosphatase (APISO): 39 U/L (ref 36–130)
BUN: 10 mg/dL (ref 7–25)
CO2: 29 mmol/L (ref 20–32)
Calcium: 8.7 mg/dL (ref 8.6–10.3)
Chloride: 103 mmol/L (ref 98–110)
Creat: 0.99 mg/dL (ref 0.60–1.35)
GFR, Est African American: 103 mL/min/{1.73_m2} (ref >=60)
GFR, Est Non African American: 89 mL/min/{1.73_m2} (ref >=60)
Globulin: 2.9 g/dL (calc) (ref 1.9–3.7)
Glucose, Bld: 83 mg/dL (ref 65–99)
Potassium: 4.2 mmol/L (ref 3.5–5.3)
Sodium: 139 mmol/L (ref 135–146)
Total Bilirubin: 1.1 mg/dL (ref 0.2–1.2)
Total Protein: 7 g/dL (ref 6.1–8.1)

## 2019-09-07 LAB — LIPID PANEL
Cholesterol: 159 mg/dL (ref <200)
HDL: 38 mg/dL — ABNORMAL LOW (ref >=40)
LDL Cholesterol (Calc): 94 mg/dL (calc)
Non-HDL Cholesterol (Calc): 121 mg/dL (calc) (ref <130)
Total CHOL/HDL Ratio: 4.2 (calc) (ref <5.0)
Triglycerides: 175 mg/dL — ABNORMAL HIGH (ref <150)

## 2019-09-07 LAB — HEMOGLOBIN A1C
Hgb A1c MFr Bld: 5.6 % of total Hgb (ref <5.7)
Mean Plasma Glucose: 114 (calc)
eAG (mmol/L): 6.3 (calc)

## 2019-09-07 LAB — CBC WITH DIFFERENTIAL/PLATELET
Absolute Monocytes: 654 cells/uL (ref 200–950)
Basophils Absolute: 48 cells/uL (ref 0–200)
Basophils Relative: 0.8 %
Eosinophils Absolute: 162 cells/uL (ref 15–500)
Eosinophils Relative: 2.7 %
HCT: 50.9 % — ABNORMAL HIGH (ref 38.5–50.0)
Hemoglobin: 17.6 g/dL — ABNORMAL HIGH (ref 13.2–17.1)
Lymphs Abs: 3156 cells/uL (ref 850–3900)
MCH: 31.4 pg (ref 27.0–33.0)
MCHC: 34.6 g/dL (ref 32.0–36.0)
MCV: 90.7 fL (ref 80.0–100.0)
MPV: 11.7 fL (ref 7.5–12.5)
Monocytes Relative: 10.9 %
Neutro Abs: 1980 cells/uL (ref 1500–7800)
Neutrophils Relative %: 33 %
Platelets: 253 10*3/uL (ref 140–400)
RBC: 5.61 10*6/uL (ref 4.20–5.80)
RDW: 11.9 % (ref 11.0–15.0)
Total Lymphocyte: 52.6 %
WBC: 6 10*3/uL (ref 3.8–10.8)

## 2019-09-10 ENCOUNTER — Ambulatory Visit (INDEPENDENT_AMBULATORY_CARE_PROVIDER_SITE_OTHER): Payer: 59 | Admitting: Internal Medicine

## 2019-09-10 ENCOUNTER — Other Ambulatory Visit: Payer: Self-pay

## 2019-09-10 ENCOUNTER — Encounter: Payer: Self-pay | Admitting: Internal Medicine

## 2019-09-10 VITALS — BP 110/80 | HR 86 | Ht 71.0 in | Wt 280.0 lb

## 2019-09-10 DIAGNOSIS — I10 Essential (primary) hypertension: Secondary | ICD-10-CM

## 2019-09-10 DIAGNOSIS — E8881 Metabolic syndrome: Secondary | ICD-10-CM

## 2019-09-10 DIAGNOSIS — E786 Lipoprotein deficiency: Secondary | ICD-10-CM

## 2019-09-10 DIAGNOSIS — K219 Gastro-esophageal reflux disease without esophagitis: Secondary | ICD-10-CM

## 2019-09-10 DIAGNOSIS — Z6839 Body mass index (BMI) 39.0-39.9, adult: Secondary | ICD-10-CM

## 2019-09-10 DIAGNOSIS — R609 Edema, unspecified: Secondary | ICD-10-CM

## 2019-09-10 DIAGNOSIS — D8689 Sarcoidosis of other sites: Secondary | ICD-10-CM

## 2019-09-10 DIAGNOSIS — Z0000 Encounter for general adult medical examination without abnormal findings: Secondary | ICD-10-CM | POA: Diagnosis not present

## 2019-09-10 DIAGNOSIS — H4922 Sixth [abducent] nerve palsy, left eye: Secondary | ICD-10-CM

## 2019-09-10 LAB — POCT URINALYSIS DIPSTICK
Appearance: NEGATIVE
Bilirubin, UA: NEGATIVE
Blood, UA: NEGATIVE
Glucose, UA: NEGATIVE
Ketones, UA: NEGATIVE
Leukocytes, UA: NEGATIVE
Nitrite, UA: NEGATIVE
Odor: NEGATIVE
Protein, UA: NEGATIVE
Spec Grav, UA: 1.01 (ref 1.010–1.025)
Urobilinogen, UA: 0.2 E.U./dL
pH, UA: 6 (ref 5.0–8.0)

## 2019-09-10 NOTE — Progress Notes (Signed)
Subjective:    Patient ID: Adrian [name], male    DOB: 03-Sep-1970, 49 y.o.   MRN: [id number]  HPI 49 year old Male for health maintenance exam and evaluation of medical issues.  He has a history of neurosarcoidosis presenting as a left 6th nerve palsy in 2019, and has been on steroids for an extended period of time and developed bone loss secondary to steroid treatment.  He has strabismus surgery x3 to improve vision/VI nerve palsy.  Continues to be followed at [facility] and gets infliximab infusions every other month regularly.  Has developed a tremor and has been started on Sinemet at [facility] to see if that will help his tremor.  Currently on hydrocortisone 10 mg twice daily.  Sees Dr. Lucianne Muss and is on AndroGel for low testosterone.  Had bone density study April 2021 with lowest Z score -1.6  Lumbar spineT  reviewed and followed by Dr. Lucianne Muss on Actonel.  Still has some issues with balance at times.  He fractured his left fourth and fifth fingers in 2014 due to a sports injury.  Left inguinal hernia repair 1998.  Remote history of periorbital fracture.  He was hospitalized with ataxia and left 6th nerve palsy June 2018.  MRI showed multiple lesions that were contrast-enhancing throughout the brain.  Lumbar puncture was performed.  ACE level was 55.  ANA was negative.  TSH was normal and sed rate was 2.  He was placed on high-dose steroids and gained a lot of weight.  He was subsequently treated with Imuran and then when he began going to [facility] treated with Remicade.  He has not been able to return to full-time work.  He has applied for disability benefits but the pandemic has slowed things down with his claim.  I do think he is entitled to full disability benefits.  Social history: He is divorced.  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  Does not smoke.  Quit smoking about 20 years ago.  He does use smokeless  tobacco  Family history: He has 1 son and 1 daughter.  Father died at age 65 due to sepsis and AML complications.  Mother died with history of ovarian cancer.  1 brother age 9 with history of obesity diabetes and history of MI.  One sister in her 35s overweight.  Review of Systems  Constitutional: Negative.   Respiratory: Negative.   Cardiovascular: Negative.   Gastrointestinal: Negative.   Psychiatric/Behavioral:       Affect appears normal       Objective:   Physical Exam Constitutional:      Appearance: Normal appearance.  HENT:     Head: Normocephalic.     Right Ear: Tympanic membrane normal.     Left Ear: Tympanic membrane normal.     Nose: Nose normal.     Mouth/Throat:     Mouth: Mucous membranes are moist.  Eyes:     Pupils: Pupils are equal, round, and reactive to light.  Neck:     Vascular: No carotid bruit.  Cardiovascular:     Rate and Rhythm: Normal rate and regular rhythm.     Heart sounds: Normal heart sounds. No murmur heard.   Pulmonary:     Effort: Pulmonary effort is normal.     Breath sounds: Normal breath sounds. No rales.  Abdominal:     Palpations: Abdomen is soft. There is no mass.  Tenderness: There is no abdominal tenderness. There is no guarding.     Hernia: No hernia is present.  Musculoskeletal:     Cervical back: Neck supple. No rigidity.  Skin:    General: Skin is warm and dry.  Neurological:     Mental Status: He is alert and oriented to person, place, and time.     Comments: Broad-based gait.  Moves slowly.  Cranial nerves II through XII grossly intact.  Alert and oriented x3.  Mild intention tremor  Psychiatric:        Mood and Affect: Mood normal.        Thought Content: Thought content normal.        Judgment: Judgment normal.           Assessment & Plan:  Neurosarcoidosis- treated at [facility] with Remicade every other month-remains on hydrocortisone 10 mg twice daily  Hx left 6th palsy s/p multiple surgeries for  strabismus  Obesity- continue to work on diet and weight loss.  Essential hypertension treated with Lasix and carvedilol  Obesity-continue to encourage diet exercise and weight loss.  History of low testosterone treated by Dr. Lucianne Muss  Blood loss from steroid treatment treated with Actonel  History of vitamin D deficiency treated with vitamin D supplement  Triglycerides elevated at 175-needs to watch diet.  GE reflux treated with Protonix  Plan: Continue current medications and follow-up here in 6 months.  Encourage diet exercise and weight loss.

## 2019-09-10 NOTE — Patient Instructions (Signed)
It was a pleasure to see you today. Watch diet and get as much exercise as possible. RTC in 6 months

## 2019-09-12 ENCOUNTER — Telehealth: Payer: Self-pay | Admitting: Internal Medicine

## 2019-09-12 NOTE — Telephone Encounter (Signed)
Faxed Medical records for 04/18/2018 to present to [facility] [phone] (13 pages)

## 2019-09-29 ENCOUNTER — Other Ambulatory Visit: Payer: Self-pay | Admitting: Endocrinology

## 2019-10-29 NOTE — Progress Notes (Signed)
Prior auth for testosterone gel pump was denied.  Appeal letter will be sent.

## 2019-11-02 ENCOUNTER — Other Ambulatory Visit: Payer: Self-pay | Admitting: Internal Medicine

## 2019-11-06 ENCOUNTER — Telehealth: Payer: Self-pay | Admitting: *Deleted

## 2019-11-06 NOTE — Telephone Encounter (Signed)
Faxed/sent complete documentation to [facility] -[phone]

## 2019-11-28 ENCOUNTER — Other Ambulatory Visit: Payer: Self-pay

## 2019-11-28 ENCOUNTER — Other Ambulatory Visit (INDEPENDENT_AMBULATORY_CARE_PROVIDER_SITE_OTHER): Payer: 59

## 2019-11-28 DIAGNOSIS — E291 Testicular hypofunction: Secondary | ICD-10-CM | POA: Diagnosis not present

## 2019-11-28 DIAGNOSIS — M810 Age-related osteoporosis without current pathological fracture: Secondary | ICD-10-CM

## 2019-11-28 LAB — BASIC METABOLIC PANEL WITH GFR
BUN: 17 mg/dL (ref 6–23)
CO2: 29 meq/L (ref 19–32)
Calcium: 9.2 mg/dL (ref 8.4–10.5)
Chloride: 104 meq/L (ref 96–112)
Creatinine, Ser: 1.04 mg/dL (ref 0.40–1.50)
GFR: 83.94 mL/min
Glucose, Bld: 89 mg/dL (ref 70–99)
Potassium: 4.1 meq/L (ref 3.5–5.1)
Sodium: 140 meq/L (ref 135–145)

## 2019-11-28 LAB — CBC
HCT: 45.4 % (ref 39.0–52.0)
Hemoglobin: 16 g/dL (ref 13.0–17.0)
MCHC: 35.1 g/dL (ref 30.0–36.0)
MCV: 91.3 fl (ref 78.0–100.0)
Platelets: 250 K/uL (ref 150.0–400.0)
RBC: 4.97 Mil/uL (ref 4.22–5.81)
RDW: 12.5 % (ref 11.5–15.5)
WBC: 5.8 K/uL (ref 4.0–10.5)

## 2019-11-28 LAB — TESTOSTERONE: Testosterone: 551.21 ng/dL (ref 300.00–890.00)

## 2019-11-30 ENCOUNTER — Encounter: Payer: Self-pay | Admitting: Endocrinology

## 2019-11-30 ENCOUNTER — Ambulatory Visit (INDEPENDENT_AMBULATORY_CARE_PROVIDER_SITE_OTHER): Payer: 59 | Admitting: Endocrinology

## 2019-11-30 ENCOUNTER — Other Ambulatory Visit: Payer: Self-pay

## 2019-11-30 VITALS — BP 134/88 | HR 75 | Resp 16 | Ht 71.0 in | Wt 278.0 lb

## 2019-11-30 DIAGNOSIS — E291 Testicular hypofunction: Secondary | ICD-10-CM
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
## 2019-12-21 ENCOUNTER — Other Ambulatory Visit: Payer: Self-pay | Admitting: Endocrinology

## 2020-01-21 ENCOUNTER — Other Ambulatory Visit: Payer: Self-pay | Admitting: Internal Medicine

## 2020-01-21 DIAGNOSIS — I10 Essential (primary) hypertension: Secondary | ICD-10-CM

## 2020-01-29 ENCOUNTER — Other Ambulatory Visit: Payer: Self-pay | Admitting: Internal Medicine

## 2020-03-10 ENCOUNTER — Other Ambulatory Visit: Payer: Self-pay | Admitting: Internal Medicine

## 2020-03-11 ENCOUNTER — Other Ambulatory Visit: Payer: Self-pay

## 2020-03-11 ENCOUNTER — Other Ambulatory Visit: Payer: 59 | Admitting: Internal Medicine

## 2020-03-11 DIAGNOSIS — E7849 Other hyperlipidemia: Secondary | ICD-10-CM

## 2020-03-11 DIAGNOSIS — E781 Pure hyperglyceridemia: Secondary | ICD-10-CM

## 2020-03-11 DIAGNOSIS — E8881 Metabolic syndrome: Secondary | ICD-10-CM

## 2020-03-11 DIAGNOSIS — I10 Essential (primary) hypertension: Secondary | ICD-10-CM

## 2020-03-12 LAB — LIPID PANEL
Cholesterol: 155 mg/dL (ref <200)
HDL: 39 mg/dL — ABNORMAL LOW (ref >=40)
LDL Cholesterol (Calc): 93 mg/dL (calc)
Non-HDL Cholesterol (Calc): 116 mg/dL (calc) (ref <130)
Total CHOL/HDL Ratio: 4 (calc) (ref <5.0)
Triglycerides: 132 mg/dL (ref <150)

## 2020-03-12 LAB — HEMOGLOBIN A1C
Hgb A1c MFr Bld: 5.5 % of total Hgb (ref <5.7)
Mean Plasma Glucose: 111 mg/dL
eAG (mmol/L): 6.2 mmol/L

## 2020-03-13 ENCOUNTER — Ambulatory Visit (INDEPENDENT_AMBULATORY_CARE_PROVIDER_SITE_OTHER): Payer: 59 | Admitting: Internal Medicine

## 2020-03-13 ENCOUNTER — Other Ambulatory Visit: Payer: Self-pay

## 2020-03-13 ENCOUNTER — Encounter: Payer: Self-pay | Admitting: Internal Medicine

## 2020-03-13 VITALS — BP 120/80 | HR 71 | Ht 71.0 in | Wt 272.0 lb

## 2020-03-13 DIAGNOSIS — D8689 Sarcoidosis of other sites: Secondary | ICD-10-CM

## 2020-03-13 NOTE — Progress Notes (Deleted)
   Subjective:    Patient ID: Adrian [name], male    DOB: 02/06/1970, 49 y.o.   MRN: [id number]  HPI  50 year old Male  for 6 month recheck   Hgb AIC 5.5% and was 5.6% in August. Triglycerides are now normal. They were 175 in August and now 132. Has low HDL 39.  Still has double vision but has corrective lenses.Continues to be followed at [facility]. Still takes Remicade.every   Review of Systems no new complaints     Objective:   Physical Exam  BP 120/80, pulse 71 regular, pulse ox 96%, weight 272 pounds.        Assessment & Plan:

## 2020-03-13 NOTE — Patient Instructions (Addendum)
Advise patient to get Covid booster. Continue same meds. Advise diet and exercise. Return in 6 months for CPE and fasting labs.

## 2020-03-13 NOTE — Progress Notes (Signed)
   Subjective:    Patient ID: Adrian [name], male    DOB: 13-Dec-1970, 50 y.o.   MRN: [id number]  HPI  50 year old Male  for 6 month recheck.  He has a history of neurosarcoidosis followed at [facility] and receives Remicade every 6 weeks IV there.  He has a new puppy.  He is excited about this.  He has osteoporosis and hypogonadism followed by Dr. Lucianne Muss.  Had low free testosterone at 3.6 and was started on testosterone supplementation after his initial visit with Dr. Lucianne Muss.  His testosterone level in October was 551.21.   Hgb AIC 5.5% and was 5.6% in August. Triglycerides are now normal. They were 175 in August and now 132. Has low HDL 39.  Still has double vision but has corrective lenses. He is able to drive.  Continues to be followed at [facility]. Still takes Remicade IV about every 6 weeks.  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  No new complaints.  Has not had third Covid vaccine and suggested that he do so right away.  Given information on where to receive this.  Review of Systems no new complaints     Objective:   Physical Exam  BP 120/80, pulse 71 regular, pulse ox 96%, weight 272 pounds. Neck is supple without JVD thyromegaly or carotid bruits.  His chest is clear to auscultation.  Cardiac exam regular rate and rhythm.  His hemoglobin A1c is stable at 5.5% and was 5.6% in August.  Triglycerides have improved from 175 to 132.  He has a low HDL of 39.  LDL and total cholesterol are normal.     Assessment & Plan:  Essential hypertension stable with Coreg 6.25 mg twice daily  Dependent edema treated with Lasix 40 mg daily  GE reflux treated with Protonix 40 mg daily  Osteoporosis treated with Actonel per Dr. Lucianne Muss  Low testosterone treated with AndroGel by Dr. Lucianne Muss  Elevated triglycerides have improved with diet alone  Plan: He will continue to work on diet and exercise.  Given  information on where to get COVID-19 booster.  Return in 6 months for health maintenance exam.  Continue current medications.

## 2020-03-27 ENCOUNTER — Encounter: Payer: Self-pay | Admitting: Gastroenterology

## 2020-04-18 ENCOUNTER — Encounter: Payer: Self-pay | Admitting: Internal Medicine

## 2020-04-18 LAB — HM DIABETES EYE EXAM

## 2020-05-02 ENCOUNTER — Other Ambulatory Visit: Payer: Self-pay | Admitting: Internal Medicine

## 2020-05-20 ENCOUNTER — Ambulatory Visit (AMBULATORY_SURGERY_CENTER): Payer: Self-pay

## 2020-05-20 ENCOUNTER — Other Ambulatory Visit: Payer: Self-pay

## 2020-05-20 VITALS — Ht 71.0 in | Wt 286.0 lb

## 2020-05-20 DIAGNOSIS — Z1211 Encounter for screening for malignant neoplasm of colon: Secondary | ICD-10-CM

## 2020-05-20 MED ORDER — NA SULFATE-K SULFATE-MG SULF 17.5-3.13-1.6 GM/177ML PO SOLN: 1.0000 | Freq: Once | ORAL | 0 refills | Status: AC

## 2020-05-20 NOTE — Progress Notes (Signed)

## 2020-05-30 ENCOUNTER — Other Ambulatory Visit: Payer: Self-pay

## 2020-05-30 ENCOUNTER — Other Ambulatory Visit (INDEPENDENT_AMBULATORY_CARE_PROVIDER_SITE_OTHER): Payer: 59

## 2020-05-30 DIAGNOSIS — E291 Testicular hypofunction: Secondary | ICD-10-CM

## 2020-05-30 LAB — CBC
HCT: 43.7 % (ref 39.0–52.0)
Hemoglobin: 15.1 g/dL (ref 13.0–17.0)
MCHC: 34.6 g/dL (ref 30.0–36.0)
MCV: 91.2 fl (ref 78.0–100.0)
Platelets: 279 10*3/uL (ref 150.0–400.0)
RBC: 4.79 Mil/uL (ref 4.22–5.81)
RDW: 12.6 % (ref 11.5–15.5)
WBC: 5.9 10*3/uL (ref 4.0–10.5)

## 2020-05-30 LAB — TESTOSTERONE: Testosterone: 329.43 ng/dL (ref 300.00–890.00)

## 2020-06-03 ENCOUNTER — Ambulatory Visit (AMBULATORY_SURGERY_CENTER): Payer: 59 | Admitting: Gastroenterology

## 2020-06-03 ENCOUNTER — Encounter: Payer: Self-pay | Admitting: Gastroenterology

## 2020-06-03 ENCOUNTER — Other Ambulatory Visit: Payer: Self-pay

## 2020-06-03 VITALS — BP 133/97 | HR 72 | Temp 96.7°F | Resp 12 | Ht 71.0 in | Wt 286.0 lb

## 2020-06-03 DIAGNOSIS — Z1211 Encounter for screening for malignant neoplasm of colon: Secondary | ICD-10-CM

## 2020-06-03 MED ORDER — SODIUM CHLORIDE 0.9 % IV SOLN: 500.0000 mL | INTRAVENOUS | Status: DC

## 2020-06-03 NOTE — Progress Notes (Signed)
A and O x3. Report to RN. Tolerated MAC anesthesia well.

## 2020-06-03 NOTE — Op Note (Signed)
[facility] Patient Name: Adrian [name] Procedure Date: 06/03/2020 10:19 AM MRN: [id number] Endoscopist: Rachael Fee , MD Age: 50 Referring MD:  Date of Birth: Nov 02, 1970 Gender: Male Account #: [id number] Procedure:                Colonoscopy Indications:              Screening for colorectal malignant neoplasm Medicines:                Monitored Anesthesia Care Procedure:                Pre-Anesthesia Assessment:                           - Prior to the procedure, a History and Physical                            was performed, and patient medications and                            allergies were reviewed. The patient's tolerance of                            previous anesthesia was also reviewed. The risks                            and benefits of the procedure and the sedation                            options and risks were discussed with the patient.                            All questions were answered, and informed consent                            was obtained. Prior Anticoagulants: The patient has                            taken no previous anticoagulant or antiplatelet                            agents. ASA Grade Assessment: II - A patient with                            mild systemic disease. After reviewing the risks                            and benefits, the patient was deemed in                            satisfactory condition to undergo the procedure.                           After obtaining informed consent, the colonoscope  was passed under direct vision. Throughout the                            procedure, the patient's blood pressure, pulse, and                            oxygen saturations were monitored continuously. The                            Olympus CF-HQ190L [id number]) Colonoscope was                            introduced through the anus and advanced to the the                            cecum, identified by  appendiceal orifice and                            ileocecal valve. The colonoscopy was performed                            without difficulty. The patient tolerated the                            procedure well. The quality of the bowel                            preparation was good. The ileocecal valve,                            appendiceal orifice, and rectum were photographed. Scope In: 10:22:27 AM Scope Out: 10:35:43 AM Scope Withdrawal Time: 0 hours 10 minutes 42 seconds  Total Procedure Duration: 0 hours 13 minutes 16 seconds  Findings:                 Multiple small and large-mouthed diverticula were                            found in the left colon.                           The exam was otherwise without abnormality on                            direct and retroflexion views. Complications:            No immediate complications. Estimated blood loss:                            None. Estimated Blood Loss:     Estimated blood loss: none. Impression:               - Diverticulosis in the left colon.                           - The examination was otherwise normal on  direct                            and retroflexion views.                           - No polyps or cancers. Recommendation:           - Patient has a contact number available for                            emergencies. The signs and symptoms of potential                            delayed complications were discussed with the                            patient. Return to normal activities tomorrow.                            Written discharge instructions were provided to the                            patient.                           - Resume previous diet.                           - Continue present medications.                           - Repeat colonoscopy in 10 years for screening. Rachael Fee, MD 06/03/2020 10:37:41 AM This report has been signed electronically.

## 2020-06-03 NOTE — Patient Instructions (Signed)
Thank you for letting us take care of your healthcare needs today. °Please see handouts given to you on Diverticulosis. ° ° ° °YOU HAD AN ENDOSCOPIC PROCEDURE TODAY AT THE [facility]:   Refer to the procedure report that was given to you for any specific questions about what was found during the examination.  If the procedure report does not answer your questions, please call your gastroenterologist to clarify.  If you requested that your care partner not be given the details of your procedure findings, then the procedure report has been included in a sealed envelope for you to review at your convenience later. ° °YOU SHOULD EXPECT: Some feelings of bloating in the abdomen. Passage of more gas than usual.  Walking can help get rid of the air that was put into your GI tract during the procedure and reduce the bloating. If you had a lower endoscopy (such as a colonoscopy or flexible sigmoidoscopy) you may notice spotting of blood in your stool or on the toilet paper. If you underwent a bowel prep for your procedure, you may not have a normal bowel movement for a few days. ° °Please Note:  You might notice some irritation and congestion in your nose or some drainage.  This is from the oxygen used during your procedure.  There is no need for concern and it should clear up in a day or so. ° °SYMPTOMS TO REPORT IMMEDIATELY: ° °Following lower endoscopy (colonoscopy or flexible sigmoidoscopy): ° Excessive amounts of blood in the stool ° Significant tenderness or worsening of abdominal pains ° Swelling of the abdomen that is new, acute ° Fever of 100°F or higher ° °For urgent or emergent issues, a gastroenterologist can be reached at any hour by calling [phone]. °Do not use MyChart messaging for urgent concerns.  ° ° °DIET:  We do recommend a small meal at first, but then you may proceed to your regular diet.  Drink plenty of fluids but you should avoid alcoholic beverages for 24 hours. ° °ACTIVITY:   You should plan to take it easy for the rest of today and you should NOT DRIVE or use heavy machinery until tomorrow (because of the sedation medicines used during the test).   ° °FOLLOW UP: °Our staff will call the number listed on your records 48-72 hours following your procedure to check on you and address any questions or concerns that you may have regarding the information given to you following your procedure. If we do not reach you, we will leave a message.  We will attempt to reach you two times.  During this call, we will ask if you have developed any symptoms of COVID 19. If you develop any symptoms (ie: fever, flu-like symptoms, shortness of breath, cough etc.) before then, please call [phone].  If you test positive for Covid 19 in the 2 weeks post procedure, please call and report this information to us.   ° °If any biopsies were taken you will be contacted by phone or by letter within the next 1-3 weeks.  Please call us at [phone] if you have not heard about the biopsies in 3 weeks.  ° ° °SIGNATURES/CONFIDENTIALITY: °You and/or your care partner have signed paperwork which will be entered into your electronic medical record.  These signatures attest to the fact that that the information above on your After Visit Summary has been reviewed and is understood.  Full responsibility of the confidentiality of this discharge information lies with you   and/or your care-partner.  °

## 2020-06-03 NOTE — Progress Notes (Signed)
Pt's states no medical or surgical changes since previsit or office visit. 

## 2020-06-04 ENCOUNTER — Ambulatory Visit: Payer: 59 | Admitting: Endocrinology

## 2020-06-05 ENCOUNTER — Telehealth: Payer: Self-pay | Admitting: *Deleted

## 2020-06-05 NOTE — Telephone Encounter (Signed)
  Follow up Call-  Call back number 06/03/2020  Post procedure Call Back phone  # [phone]  Permission to leave phone message Yes  Some recent data might be hidden     Patient questions:  Do you have a fever, pain , or abdominal swelling? No. Pain Score  0 *  Have you tolerated food without any problems? Yes.    Have you been able to return to your normal activities? Yes.    Do you have any questions about your discharge instructions: Diet   No. Medications  No. Follow up visit  No.  Do you have questions or concerns about your Care? No.  Actions: * If pain score is 4 or above: No action needed, pain <4.  1. Have you developed a fever since your procedure? no  2.   Have you had an respiratory symptoms (SOB or cough) since your procedure? no  3.   Have you tested positive for COVID 19 since your procedure no  4.   Have you had any family members/close contacts diagnosed with the COVID 19 since your procedure?  no   If yes to any of these questions please route to Laverna Peace, RN and Karlton Lemon, RN

## 2020-07-02 ENCOUNTER — Other Ambulatory Visit: Payer: Self-pay | Admitting: Endocrinology

## 2020-07-16 ENCOUNTER — Other Ambulatory Visit: Payer: Self-pay | Admitting: Endocrinology

## 2020-08-29 ENCOUNTER — Other Ambulatory Visit: Payer: Self-pay | Admitting: Internal Medicine

## 2020-09-09 ENCOUNTER — Other Ambulatory Visit: Payer: 59 | Admitting: Internal Medicine

## 2020-09-09 ENCOUNTER — Other Ambulatory Visit: Payer: Self-pay

## 2020-09-09 DIAGNOSIS — Z0000 Encounter for general adult medical examination without abnormal findings: Secondary | ICD-10-CM

## 2020-09-09 DIAGNOSIS — I10 Essential (primary) hypertension: Secondary | ICD-10-CM

## 2020-09-09 DIAGNOSIS — K219 Gastro-esophageal reflux disease without esophagitis: Secondary | ICD-10-CM

## 2020-09-09 DIAGNOSIS — E8881 Metabolic syndrome: Secondary | ICD-10-CM

## 2020-09-09 DIAGNOSIS — Z9189 Other specified personal risk factors, not elsewhere classified: Secondary | ICD-10-CM

## 2020-09-09 DIAGNOSIS — E88819 Insulin resistance, unspecified: Secondary | ICD-10-CM

## 2020-09-09 DIAGNOSIS — M8589 Other specified disorders of bone density and structure, multiple sites: Secondary | ICD-10-CM

## 2020-09-09 DIAGNOSIS — E7849 Other hyperlipidemia: Secondary | ICD-10-CM

## 2020-09-09 DIAGNOSIS — Z6836 Body mass index (BMI) 36.0-36.9, adult: Secondary | ICD-10-CM

## 2020-09-10 LAB — COMPLETE METABOLIC PANEL WITH GFR
AG Ratio: 1.4 (calc) (ref 1.0–2.5)
ALT: 25 U/L (ref 9–46)
AST: 22 U/L (ref 10–35)
Albumin: 4 g/dL (ref 3.6–5.1)
Alkaline phosphatase (APISO): 40 U/L (ref 35–144)
BUN: 14 mg/dL (ref 7–25)
CO2: 25 mmol/L (ref 20–32)
Calcium: 8.9 mg/dL (ref 8.6–10.3)
Chloride: 104 mmol/L (ref 98–110)
Creat: 0.92 mg/dL (ref 0.70–1.30)
Globulin: 2.8 g/dL (calc) (ref 1.9–3.7)
Glucose, Bld: 96 mg/dL (ref 65–99)
Potassium: 3.9 mmol/L (ref 3.5–5.3)
Sodium: 139 mmol/L (ref 135–146)
Total Bilirubin: 0.7 mg/dL (ref 0.2–1.2)
Total Protein: 6.8 g/dL (ref 6.1–8.1)
eGFR: 101 mL/min/{1.73_m2} (ref >=60)

## 2020-09-10 LAB — CBC WITH DIFFERENTIAL/PLATELET
Absolute Monocytes: 395 cells/uL (ref 200–950)
Basophils Absolute: 20 cells/uL (ref 0–200)
Basophils Relative: 0.4 %
Eosinophils Absolute: 110 cells/uL (ref 15–500)
Eosinophils Relative: 2.2 %
HCT: 44 % (ref 38.5–50.0)
Hemoglobin: 14.9 g/dL (ref 13.2–17.1)
Lymphs Abs: 3275 cells/uL (ref 850–3900)
MCH: 31.2 pg (ref 27.0–33.0)
MCHC: 33.9 g/dL (ref 32.0–36.0)
MCV: 92.2 fL (ref 80.0–100.0)
MPV: 11.3 fL (ref 7.5–12.5)
Monocytes Relative: 7.9 %
Neutro Abs: 1200 cells/uL — ABNORMAL LOW (ref 1500–7800)
Neutrophils Relative %: 24 %
Platelets: 240 10*3/uL (ref 140–400)
RBC: 4.77 10*6/uL (ref 4.20–5.80)
RDW: 12.2 % (ref 11.0–15.0)
Total Lymphocyte: 65.5 %
WBC: 5 10*3/uL (ref 3.8–10.8)

## 2020-09-10 LAB — HEMOGLOBIN A1C
Hgb A1c MFr Bld: 5.3 % of total Hgb (ref <5.7)
Mean Plasma Glucose: 105 mg/dL
eAG (mmol/L): 5.8 mmol/L

## 2020-09-10 LAB — LIPID PANEL
Cholesterol: 138 mg/dL (ref <200)
HDL: 39 mg/dL — ABNORMAL LOW (ref >=40)
LDL Cholesterol (Calc): 79 mg/dL (calc)
Non-HDL Cholesterol (Calc): 99 mg/dL (calc) (ref <130)
Total CHOL/HDL Ratio: 3.5 (calc) (ref <5.0)
Triglycerides: 114 mg/dL (ref <150)

## 2020-09-10 LAB — PSA: PSA: 0.23 ng/mL (ref <=4.00)

## 2020-09-11 ENCOUNTER — Other Ambulatory Visit: Payer: Self-pay

## 2020-09-11 ENCOUNTER — Encounter: Payer: Self-pay | Admitting: Internal Medicine

## 2020-09-11 ENCOUNTER — Ambulatory Visit (INDEPENDENT_AMBULATORY_CARE_PROVIDER_SITE_OTHER): Payer: 59 | Admitting: Internal Medicine

## 2020-09-11 VITALS — BP 110/80 | HR 80 | Ht 71.0 in | Wt 276.0 lb

## 2020-09-11 DIAGNOSIS — I10 Essential (primary) hypertension: Secondary | ICD-10-CM | POA: Diagnosis not present

## 2020-09-11 DIAGNOSIS — M8589 Other specified disorders of bone density and structure, multiple sites: Secondary | ICD-10-CM

## 2020-09-11 DIAGNOSIS — Z9189 Other specified personal risk factors, not elsewhere classified: Secondary | ICD-10-CM | POA: Diagnosis not present

## 2020-09-11 DIAGNOSIS — R7989 Other specified abnormal findings of blood chemistry: Secondary | ICD-10-CM

## 2020-09-11 DIAGNOSIS — Z0000 Encounter for general adult medical examination without abnormal findings: Secondary | ICD-10-CM

## 2020-09-11 DIAGNOSIS — E7849 Other hyperlipidemia: Secondary | ICD-10-CM

## 2020-09-11 DIAGNOSIS — D8689 Sarcoidosis of other sites: Secondary | ICD-10-CM | POA: Diagnosis not present

## 2020-09-11 DIAGNOSIS — E786 Lipoprotein deficiency: Secondary | ICD-10-CM

## 2020-09-11 DIAGNOSIS — R609 Edema, unspecified: Secondary | ICD-10-CM | POA: Diagnosis not present

## 2020-09-11 DIAGNOSIS — H4922 Sixth [abducent] nerve palsy, left eye: Secondary | ICD-10-CM

## 2020-09-11 DIAGNOSIS — K219 Gastro-esophageal reflux disease without esophagitis: Secondary | ICD-10-CM

## 2020-09-11 DIAGNOSIS — E88819 Insulin resistance, unspecified: Secondary | ICD-10-CM

## 2020-09-11 DIAGNOSIS — E8881 Metabolic syndrome: Secondary | ICD-10-CM

## 2020-09-11 LAB — POCT URINALYSIS DIPSTICK
Appearance: NEGATIVE
Bilirubin, UA: NEGATIVE
Blood, UA: NEGATIVE
Glucose, UA: NEGATIVE
Ketones, UA: NEGATIVE
Leukocytes, UA: NEGATIVE
Nitrite, UA: NEGATIVE
Odor: NEGATIVE
Protein, UA: POSITIVE — AB
Spec Grav, UA: 1.01 (ref 1.010–1.025)
Urobilinogen, UA: 0.2 E.U./dL
pH, UA: 6.5 (ref 5.0–8.0)

## 2020-09-11 NOTE — Patient Instructions (Addendum)
It was a pleasure to see you today.  Please continue to work on weight loss efforts.  Please continue current medications.  Follow-up in 1 year or as needed.  Tetanus immunization is up-to-date.  Records indicate you have only had 2 COVID-19 immunizations.  Please consider booster this Fall if Physicians at [facility] agree.

## 2020-09-11 NOTE — Progress Notes (Signed)
Subjective:    Patient ID: Adrian [name], male    DOB: [birth year], 50 y.o.   MRN: [id number]  HPI 50 year old Male seen for health maintenance exam and evaluation of medical issues.  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  This presented as a left 6th nerve palsy in 2019.  He was treated with steroids for an extended period of time and developed bone loss secondary to steroid treatment.  He had strabismus surgery x3 to improve vision/6th nerve palsy.  He sees Dr. Lucianne Muss, Endocrinologist and is treated with AndroGel for low testosterone.  Dr. Lucianne Muss also follows his bone density.  In April 2021 lowest Z score was -1.6 in the lumbar spine.  Repeat study was recommended in 2 years.  He is on Actonel 150 mg monthly.  He has a history of GE reflux treated with Protonix.  History of dependent edema and hypertension, he is treated with Lasix 40 mg daily.  Takes Zyrtec as needed for allergic rhinitis.  He is also on carvedilol 6.25 mg twice daily for essential hypertension.   For exercise, patient has tried the treadmill at the gym. Walks slowly. Balance is affected.  Labs reviewed: Hgb AIC 5.3%, PSA 0.23, Lipid panel HDL 39, LDL 79 and triglycerides 114.  Followed by Dr. Gaynell Face at [facility], Neurologist and receives Infleximab 10 mg infusion q 6 weeks through [facility] Neurology department.  Also followed by Dr. 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  Further eye surgery has been discussed with patient but he says he has been told he may only get minimal benefit so he is not willing to pursue this at this time.  He is able to drive but has to be careful with his head position. Plans are to repeat MRI of brain in November at [facility]. MRI at [facility] November 2021 no evidence of intraparenchymal contrast enhancement. He is on Sinemet for an essential tremor.  He fractured his left fourth and fifth fingers  in 2014 due to a sports injury.  Left inguinal hernia repair 1998.  Remote history of a periorbital fracture.  He was hospitalized with ataxia and left 6th nerve palsy in June 2018.  MRI showed multiple lesions that were contrast-enhancing throughout the brain.  Lumbar puncture was performed.  ACE level was 55.  ANA was negative.  TSH was normal and sed rate was 2.  He was placed on high-dose steroids and gained a lot of weight.  He was subsequently treated with Imuran and was subsequently referred to [facility] where he was treated with Remicade.  He has not been able to return to work full-time.  I do think he is entitled to full disability benefits.  Social history: He is divorced.  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  Does not smoke.  Quit smoking about 20 years ago.  He does use smokeless tobacco.  Family history: He has 1 son and 1 daughter.  Father died at age 48 due to sepsis and AML complications.  Mother died with history of ovarian cancer.  1 brother age 40 with history of obesity, diabetes and history of MI.  1 sister in her 81s who is overweight.        Review of Systems he does drive but has to hold his hand a certain way in order to see well.  He has had no  motor vehicle accidents since he was diagnosed with neurosarcoidosis.     Objective:   Physical Exam BP 110/80 pulse 80 regular pulse Oximeter 97% Weight 276 lbs., Ht 85ft 11in. ,BMI 38.49. Skin is warm and dry.  No cervical adenopathy.  TMs clear.  No facial asymmetry.  He is alert and oriented x3.  No thyromegaly.  No carotid bruits.  Chest is clear to auscultation.  Cardiac exam: Regular rate and rhythm.  No ectopy.  Abdomen soft nondistended without hepatosplenomegaly masses or tenderness.  Rectal exam: Prostate is normal.  He has trace to 11+ pitting edema of the ankles.  Extraocular movements are good.  PERRLA.  Muscle strength is normal.  Affect thought and  judgment are normal.      Assessment & Plan:  Neurosarcoidosis treated at [facility] with Infleximab infusions every 6 weeks.  History of left 6th nerve palsy treated with corrective eye surgery and markedly improved  Dependent edema  BMI 38.49-needs to continue to work on diet and exercise  Low testosterone treated with topical testosterone replacement  Osteopenia treated with Actonel  GE reflux treated with Protonix  Hypertension treated with carvedilol and Lasix  Allergic rhinitis treated with Zyrtec  History of tremor treated with Sinemet  Low HDL of 39  Review of labs shows his hemoglobin A1c to be excellent at 5.3%, PSA is normal, lipid panel remarkable for low HDL of 39 otherwise normal.  CBC normal with the exception of decreased absolute neutrophils of 1200 normal being 20 1570 800.  Liver functions, BUN and creatinine are normal.  Estimated GFR is 101.  Electrolytes are normal.  Serum proteins are normal.  Plan: It is my opinion that he is completely disabled and should receive disability benefits.  He cannot do the type of work he was doing for the [employer] at [employer] i.e. a Data processing manager due to ataxia and history of left 6th nerve palsy.  Due to being on high-dose steroids,he gained a lot of weight and remains overweight.

## 2020-09-12 ENCOUNTER — Telehealth: Payer: Self-pay | Admitting: Internal Medicine

## 2020-09-12 DIAGNOSIS — Z0289 Encounter for other administrative examinations: Secondary | ICD-10-CM

## 2020-09-12 LAB — MICROALBUMIN / CREATININE URINE RATIO
Creatinine, Urine: 206 mg/dL (ref 20–320)
Microalb Creat Ratio: 4 mcg/mg creat (ref <30)
Microalb, Ur: 0.8 mg/dL

## 2020-09-12 NOTE — Telephone Encounter (Signed)
Completed form, faxed form along with 171 pages of medical records to [facility] Department of [place], [employer] fax [phone], phone [phone]

## 2020-09-12 NOTE — Telephone Encounter (Signed)
Called and left [place] a voice Mail that form was completed and had been faxed along with 171 pages of medical records.

## 2020-12-03 ENCOUNTER — Other Ambulatory Visit: Payer: Self-pay | Admitting: Internal Medicine

## 2020-12-03 DIAGNOSIS — I10 Essential (primary) hypertension: Secondary | ICD-10-CM

## 2021-01-30 ENCOUNTER — Other Ambulatory Visit: Payer: Self-pay | Admitting: Internal Medicine

## 2021-02-06 DIAGNOSIS — D8689 Sarcoidosis of other sites: Secondary | ICD-10-CM | POA: Diagnosis not present

## 2021-02-10 DIAGNOSIS — D8689 Sarcoidosis of other sites: Secondary | ICD-10-CM | POA: Diagnosis not present

## 2021-02-26 DIAGNOSIS — D8689 Sarcoidosis of other sites: Secondary | ICD-10-CM | POA: Diagnosis not present

## 2021-03-03 ENCOUNTER — Other Ambulatory Visit: Payer: Self-pay | Admitting: Internal Medicine

## 2021-04-09 DIAGNOSIS — D8689 Sarcoidosis of other sites: Secondary | ICD-10-CM | POA: Diagnosis not present

## 2021-05-21 DIAGNOSIS — D8689 Sarcoidosis of other sites: Secondary | ICD-10-CM | POA: Diagnosis not present

## 2021-06-16 ENCOUNTER — Telehealth: Payer: Self-pay

## 2021-06-16 NOTE — Telephone Encounter (Signed)
[place]: [id number] - PA Case ID: [id number] - Rx #: [id number] Need help? Call us at [phone] ?Outcome ?Approvedtoday ?Your PA request has been approved. Additional information will be provided in the approval communication. (Message 1145) ?Drug ?Pantoprazole Sodium 40MG  dr tablets ?Form ?[employer] Electronic PA Form ([phone] NCPDP) ?Original Claim Info ?71 Plan Limitations Exceeded ?

## 2021-07-02 DIAGNOSIS — D8689 Sarcoidosis of other sites: Secondary | ICD-10-CM | POA: Diagnosis not present

## 2021-08-06 DIAGNOSIS — Z79899 Other long term (current) drug therapy: Secondary | ICD-10-CM | POA: Diagnosis not present

## 2021-08-06 DIAGNOSIS — D8689 Sarcoidosis of other sites: Secondary | ICD-10-CM | POA: Diagnosis not present

## 2021-08-13 DIAGNOSIS — D8689 Sarcoidosis of other sites: Secondary | ICD-10-CM | POA: Diagnosis not present

## 2021-09-09 ENCOUNTER — Encounter (INDEPENDENT_AMBULATORY_CARE_PROVIDER_SITE_OTHER): Payer: Self-pay

## 2021-09-11 ENCOUNTER — Other Ambulatory Visit: Payer: 59

## 2021-09-11 DIAGNOSIS — E7849 Other hyperlipidemia: Secondary | ICD-10-CM | POA: Diagnosis not present

## 2021-09-11 DIAGNOSIS — E8881 Metabolic syndrome: Secondary | ICD-10-CM | POA: Diagnosis not present

## 2021-09-11 DIAGNOSIS — Z125 Encounter for screening for malignant neoplasm of prostate: Secondary | ICD-10-CM

## 2021-09-12 LAB — COMPLETE METABOLIC PANEL WITH GFR
AG Ratio: 1.5 (calc) (ref 1.0–2.5)
ALT: 31 U/L (ref 9–46)
AST: 25 U/L (ref 10–35)
Albumin: 4.1 g/dL (ref 3.6–5.1)
Alkaline phosphatase (APISO): 46 U/L (ref 35–144)
BUN: 15 mg/dL (ref 7–25)
CO2: 29 mmol/L (ref 20–32)
Calcium: 9 mg/dL (ref 8.6–10.3)
Chloride: 103 mmol/L (ref 98–110)
Creat: 0.94 mg/dL (ref 0.70–1.30)
Globulin: 2.8 g/dL (calc) (ref 1.9–3.7)
Glucose, Bld: 95 mg/dL (ref 65–99)
Potassium: 4.5 mmol/L (ref 3.5–5.3)
Sodium: 138 mmol/L (ref 135–146)
Total Bilirubin: 0.7 mg/dL (ref 0.2–1.2)
Total Protein: 6.9 g/dL (ref 6.1–8.1)
eGFR: 98 mL/min/{1.73_m2} (ref >=60)

## 2021-09-12 LAB — CBC WITH DIFFERENTIAL/PLATELET
Absolute Monocytes: 578 cells/uL (ref 200–950)
Basophils Absolute: 61 cells/uL (ref 0–200)
Basophils Relative: 0.9 %
Eosinophils Absolute: 184 cells/uL (ref 15–500)
Eosinophils Relative: 2.7 %
HCT: 42.8 % (ref 38.5–50.0)
Hemoglobin: 15.1 g/dL (ref 13.2–17.1)
Lymphs Abs: 3393 cells/uL (ref 850–3900)
MCH: 31.7 pg (ref 27.0–33.0)
MCHC: 35.3 g/dL (ref 32.0–36.0)
MCV: 89.7 fL (ref 80.0–100.0)
MPV: 10.7 fL (ref 7.5–12.5)
Monocytes Relative: 8.5 %
Neutro Abs: 2584 cells/uL (ref 1500–7800)
Neutrophils Relative %: 38 %
Platelets: 278 10*3/uL (ref 140–400)
RBC: 4.77 10*6/uL (ref 4.20–5.80)
RDW: 11.8 % (ref 11.0–15.0)
Total Lymphocyte: 49.9 %
WBC: 6.8 10*3/uL (ref 3.8–10.8)

## 2021-09-12 LAB — LIPID PANEL
Cholesterol: 139 mg/dL (ref <200)
HDL: 36 mg/dL — ABNORMAL LOW (ref >=40)
LDL Cholesterol (Calc): 83 mg/dL (calc)
Non-HDL Cholesterol (Calc): 103 mg/dL (calc) (ref <130)
Total CHOL/HDL Ratio: 3.9 (calc) (ref <5.0)
Triglycerides: 106 mg/dL (ref <150)

## 2021-09-12 LAB — PSA: PSA: 0.23 ng/mL (ref <=4.00)

## 2021-09-17 ENCOUNTER — Other Ambulatory Visit: Payer: Self-pay | Admitting: Internal Medicine

## 2021-09-18 ENCOUNTER — Encounter: Payer: Self-pay | Admitting: Internal Medicine

## 2021-09-18 ENCOUNTER — Ambulatory Visit (INDEPENDENT_AMBULATORY_CARE_PROVIDER_SITE_OTHER): Payer: 59 | Admitting: Internal Medicine

## 2021-09-18 VITALS — BP 130/82 | HR 84 | Temp 98.7°F | Wt 239.8 lb

## 2021-09-18 DIAGNOSIS — M8580 Other specified disorders of bone density and structure, unspecified site: Secondary | ICD-10-CM | POA: Diagnosis not present

## 2021-09-18 DIAGNOSIS — D8689 Sarcoidosis of other sites: Secondary | ICD-10-CM | POA: Diagnosis not present

## 2021-09-18 DIAGNOSIS — K219 Gastro-esophageal reflux disease without esophagitis: Secondary | ICD-10-CM

## 2021-09-18 DIAGNOSIS — Z0000 Encounter for general adult medical examination without abnormal findings: Secondary | ICD-10-CM | POA: Diagnosis not present

## 2021-09-18 DIAGNOSIS — R609 Edema, unspecified: Secondary | ICD-10-CM | POA: Diagnosis not present

## 2021-09-18 DIAGNOSIS — Z6833 Body mass index (BMI) 33.0-33.9, adult: Secondary | ICD-10-CM

## 2021-09-18 DIAGNOSIS — I10 Essential (primary) hypertension: Secondary | ICD-10-CM | POA: Diagnosis not present

## 2021-09-18 DIAGNOSIS — E786 Lipoprotein deficiency: Secondary | ICD-10-CM

## 2021-09-18 DIAGNOSIS — T50905A Adverse effect of unspecified drugs, medicaments and biological substances, initial encounter: Secondary | ICD-10-CM

## 2021-09-18 LAB — POCT URINALYSIS DIPSTICK
Bilirubin, UA: NEGATIVE
Blood, UA: NEGATIVE
Glucose, UA: NEGATIVE
Ketones, UA: NEGATIVE
Leukocytes, UA: NEGATIVE
Nitrite, UA: NEGATIVE
Protein, UA: NEGATIVE
Spec Grav, UA: 1.01 (ref 1.010–1.025)
Urobilinogen, UA: 0.2 E.U./dL
pH, UA: 7 (ref 5.0–8.0)

## 2021-09-18 NOTE — Patient Instructions (Addendum)
Coronary calcium score mentioned to him. Family Hx of MI in brother. Labs reviewed with him and are stable. Has low HDL which is longstanding. Colonoscopy up to date. Vaccines discussed.  Return in 1 year or as needed.  Bone density study ordered.

## 2021-09-18 NOTE — Progress Notes (Signed)
Subjective:    Patient ID: Adrian [name], male    DOB: December 19, 1970, 51 y.o.   MRN: [id number]  HPI 51 year old Male seen for health maintenance exam and evaluation of medical issues. Still being seen at [facility] for Neurosarcoidosis.  Followed by Dr. Gaynell Face in Neurology.  May be taken off of Remicade in January he says.  He has had several strabismus surgeries to improve his vision and VI nerve palsy related to neurosarcoidosis.  Neurosarcoidosis presented initially as a left 6th nerve palsy in 2019.  He was treated with steroids for an extended time and developed bone loss secondary to steroid treatment.  He sees Dr. Lucianne Muss, Endocrinologist and has been treated with AndroGel for low testosterone.  Dr. Lucianne Muss also follows his bone density scans.  His last scan was in March 2021.  He has been taking generic Actonel 150 mg monthly for this.  It looks like this prescription expired in June 2023.  Colonoscopy done May 2022- 10 year follow up recommended.  He was hospitalized with ataxia and left 6th nerve palsy in June 2018.  MRI showed multiple lesions that were contrast-enhancing throughout the brain.  Lumbar puncture was performed.  ACE level was 55.  ANA was negative.  TSH was normal and sed rate was 2.  He was placed on high-dose steroids and gained a lot of weight.  He was subsequently treated with Imuran and was subsequently referred to [facility] where he was treated with Remicade.  Forms have been completed for him to receive full disability benefits.  He has not been able to return to work full-time.  He had fractured left fourth and fifth fingers in 2014 due to a sports injury.  Left inguinal hernia repair 1998.  Remote history of a periorbital fracture.  Has received Remicade injections every 6 weeks through [facility] neurology department, Dr. Gaynell Face.  Also has seen Dr. Adalberto Cole, Ophthalmologist for eye motility disorder and monocular esotropia above left eye related  to neurosarcoidosis.  He is able to drive but has to keep his hand in a certain position in order to see well.  He also has been prescribed Sinemet for an essential tremor.  He is asking about using labs from [place]. Lipids are not done there nor is PSA. It  will depend on what he has had drawn recently. Labs drawn  here were $99 this time and are easily available in our database.  Declines pneumococcal vaccine. Shingrix discussed. Covid booster recommended in Fall. Declines Flu vaccine.  Social Hx: He is divorced.  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  Does not smoke.  Quit smoking about 20 years ago.  He does use smokeless tobacco.  FamilyHx: He has 1 son and 1 daughter.  Father died at age 20 due to sepsis and AML complications.  Mother died with history of ovarian cancer.  1 brother in his mid 70s with history of obesity, diabetes and history of MI.  1 sister in her 37s who is overweight.    Review of Systems exercises ay gym about 2 days a week. No SOB, chest pain, GI issues, urinary symptoms. No bowel issues.     Objective:   Physical Exam Vital signs are reviewed.  His BMI is 33.45.  Weight 239 pounds 12.8 ounces blood pressure 130/82 pulse oximetry 96% pulse 84 Skin: Warm and dry.  No cervical adenopathy.  TMs clear.  Neck supple.  No thyromegaly.  Chest clear.  Cardiac exam: Regular rate and rhythm without ectopy.  Abdomen soft nondistended without hepatosplenomegaly masses or tenderness.  Rectal exam: Prostate is normal.  Trace lower extremity edema.  Muscle strength is normal.  PERRLA.  Extraocular movements are good.  Affect, thought and judgment are normal.  He has no facial asymmetry.  He is alert and oriented x3.      Assessment & Plan:  I am pleased with his weight loss.  His BMI is now 33.45 and was 38.49 in 2022  Neurosarcoidosis treated at [facility] on Sinemet 25-103 times daily.  Presented with  left 6th nerve palsy which has been treated with corrective eye surgery and is markedly improved  Dependent edema-resolved with weight loss  History of osteopenia treated with Actonel-May need follow-up with Dr. Lucianne Muss, endocrinologist  GE reflux treated with Protonix  Hypertension treated with carvedilol and Lasix  Plan: Would like to see him continue to lose a bit more weight.  Encouraged him to be physically active is much as possible.  Return in 1 year or as needed.  His last bone density study on file was in 2021 here.  This needs to be repeated.  This has been ordered.

## 2021-09-24 DIAGNOSIS — D8689 Sarcoidosis of other sites: Secondary | ICD-10-CM | POA: Diagnosis not present

## 2021-10-19 ENCOUNTER — Telehealth: Payer: Self-pay | Admitting: Internal Medicine

## 2021-10-19 NOTE — Telephone Encounter (Signed)
LVM to call and get bone density scheduled

## 2021-10-19 NOTE — Telephone Encounter (Signed)
Patient called back and will call and schedule his bone density

## 2021-10-28 ENCOUNTER — Ambulatory Visit
Admission: RE | Admit: 2021-10-28 | Discharge: 2021-10-28 | Disposition: A | Discharge status: Home / Self Care | Payer: 59 | Source: Ambulatory Visit | Attending: Internal Medicine | Admitting: Internal Medicine

## 2021-11-05 DIAGNOSIS — D8689 Sarcoidosis of other sites: Secondary | ICD-10-CM | POA: Diagnosis not present

## 2021-11-23 ENCOUNTER — Other Ambulatory Visit: Payer: Self-pay | Admitting: Internal Medicine

## 2021-12-07 ENCOUNTER — Other Ambulatory Visit: Payer: Self-pay | Admitting: Internal Medicine

## 2021-12-07 DIAGNOSIS — I10 Essential (primary) hypertension: Secondary | ICD-10-CM

## 2021-12-28 ENCOUNTER — Other Ambulatory Visit: Payer: Self-pay | Admitting: Internal Medicine

## 2022-01-28 DIAGNOSIS — D8689 Sarcoidosis of other sites: Secondary | ICD-10-CM | POA: Diagnosis not present

## 2022-02-10 DIAGNOSIS — D8689 Sarcoidosis of other sites: Secondary | ICD-10-CM | POA: Diagnosis not present

## 2022-02-11 DIAGNOSIS — D8689 Sarcoidosis of other sites: Secondary | ICD-10-CM | POA: Diagnosis not present

## 2022-02-11 DIAGNOSIS — Z79899 Other long term (current) drug therapy: Secondary | ICD-10-CM | POA: Diagnosis not present

## 2022-03-05 ENCOUNTER — Other Ambulatory Visit: Payer: Self-pay | Admitting: Internal Medicine

## 2022-04-27 DIAGNOSIS — D8689 Sarcoidosis of other sites: Secondary | ICD-10-CM | POA: Diagnosis not present

## 2022-06-05 ENCOUNTER — Other Ambulatory Visit: Payer: Self-pay | Admitting: Internal Medicine

## 2022-06-08 DIAGNOSIS — D8689 Sarcoidosis of other sites: Secondary | ICD-10-CM | POA: Diagnosis not present

## 2022-07-15 ENCOUNTER — Other Ambulatory Visit: Payer: Self-pay | Admitting: Internal Medicine

## 2022-07-20 DIAGNOSIS — D8689 Sarcoidosis of other sites: Secondary | ICD-10-CM | POA: Diagnosis not present

## 2022-08-12 DIAGNOSIS — D8689 Sarcoidosis of other sites: Secondary | ICD-10-CM | POA: Diagnosis not present

## 2022-08-31 DIAGNOSIS — D8689 Sarcoidosis of other sites: Secondary | ICD-10-CM | POA: Diagnosis not present

## 2022-09-20 ENCOUNTER — Other Ambulatory Visit: Payer: Medicare HMO

## 2022-09-20 DIAGNOSIS — I10 Essential (primary) hypertension: Secondary | ICD-10-CM | POA: Diagnosis not present

## 2022-09-20 DIAGNOSIS — T50905A Adverse effect of unspecified drugs, medicaments and biological substances, initial encounter: Secondary | ICD-10-CM | POA: Diagnosis not present

## 2022-09-20 DIAGNOSIS — D8689 Sarcoidosis of other sites: Secondary | ICD-10-CM

## 2022-09-20 DIAGNOSIS — K219 Gastro-esophageal reflux disease without esophagitis: Secondary | ICD-10-CM | POA: Diagnosis not present

## 2022-09-20 DIAGNOSIS — Z0000 Encounter for general adult medical examination without abnormal findings: Secondary | ICD-10-CM

## 2022-09-20 DIAGNOSIS — Z125 Encounter for screening for malignant neoplasm of prostate: Secondary | ICD-10-CM | POA: Diagnosis not present

## 2022-09-20 DIAGNOSIS — R739 Hyperglycemia, unspecified: Secondary | ICD-10-CM | POA: Diagnosis not present

## 2022-09-20 DIAGNOSIS — M8580 Other specified disorders of bone density and structure, unspecified site: Secondary | ICD-10-CM | POA: Diagnosis not present

## 2022-09-20 DIAGNOSIS — Z6833 Body mass index (BMI) 33.0-33.9, adult: Secondary | ICD-10-CM

## 2022-09-20 DIAGNOSIS — E786 Lipoprotein deficiency: Secondary | ICD-10-CM | POA: Diagnosis not present

## 2022-09-22 ENCOUNTER — Encounter: Payer: Self-pay | Admitting: Internal Medicine

## 2022-09-22 ENCOUNTER — Ambulatory Visit: Payer: Medicare HMO | Admitting: Internal Medicine

## 2022-09-22 VITALS — BP 140/90 | Ht 71.0 in | Wt 292.0 lb

## 2022-09-22 DIAGNOSIS — M8580 Other specified disorders of bone density and structure, unspecified site: Secondary | ICD-10-CM

## 2022-09-22 DIAGNOSIS — D8689 Sarcoidosis of other sites: Secondary | ICD-10-CM

## 2022-09-22 DIAGNOSIS — K219 Gastro-esophageal reflux disease without esophagitis: Secondary | ICD-10-CM

## 2022-09-22 DIAGNOSIS — R609 Edema, unspecified: Secondary | ICD-10-CM | POA: Diagnosis not present

## 2022-09-22 DIAGNOSIS — Z0000 Encounter for general adult medical examination without abnormal findings: Secondary | ICD-10-CM | POA: Diagnosis not present

## 2022-09-22 DIAGNOSIS — T50905A Adverse effect of unspecified drugs, medicaments and biological substances, initial encounter: Secondary | ICD-10-CM

## 2022-09-22 DIAGNOSIS — Z6841 Body Mass Index (BMI) 40.0 and over, adult: Secondary | ICD-10-CM | POA: Diagnosis not present

## 2022-09-22 DIAGNOSIS — I10 Essential (primary) hypertension: Secondary | ICD-10-CM | POA: Diagnosis not present

## 2022-09-22 LAB — POCT URINALYSIS DIP (CLINITEK)
Bilirubin, UA: NEGATIVE
Blood, UA: NEGATIVE
Glucose, UA: NEGATIVE mg/dL
Ketones, POC UA: NEGATIVE mg/dL
Leukocytes, UA: NEGATIVE
Nitrite, UA: NEGATIVE
POC PROTEIN,UA: NEGATIVE
Spec Grav, UA: 1.01 (ref 1.010–1.025)
Urobilinogen, UA: 0.2 E.U./dL
pH, UA: 7.5 (ref 5.0–8.0)

## 2022-09-22 LAB — CBC WITH DIFFERENTIAL/PLATELET
Absolute Monocytes: 498 {cells}/uL (ref 200–950)
Basophils Absolute: 50 {cells}/uL (ref 0–200)
Basophils Relative: 0.8 %
Eosinophils Absolute: 139 {cells}/uL (ref 15–500)
Eosinophils Relative: 2.2 %
HCT: 44.5 % (ref 38.5–50.0)
Hemoglobin: 15.5 g/dL (ref 13.2–17.1)
Lymphs Abs: 3421 {cells}/uL (ref 850–3900)
MCH: 31.9 pg (ref 27.0–33.0)
MCHC: 34.8 g/dL (ref 32.0–36.0)
MCV: 91.6 fL (ref 80.0–100.0)
MPV: 10.2 fL (ref 7.5–12.5)
Monocytes Relative: 7.9 %
Neutro Abs: 2192 {cells}/uL (ref 1500–7800)
Neutrophils Relative %: 34.8 %
Platelets: 267 10*3/uL (ref 140–400)
RBC: 4.86 10*6/uL (ref 4.20–5.80)
RDW: 12 % (ref 11.0–15.0)
Total Lymphocyte: 54.3 %
WBC: 6.3 10*3/uL (ref 3.8–10.8)

## 2022-09-22 LAB — LIPID PANEL
Cholesterol: 150 mg/dL (ref <200)
HDL: 46 mg/dL (ref >=40)
LDL Cholesterol (Calc): 83 mg/dL
Non-HDL Cholesterol (Calc): 104 mg/dL (calc) (ref <130)
Total CHOL/HDL Ratio: 3.3 (calc) (ref <5.0)
Triglycerides: 112 mg/dL (ref <150)

## 2022-09-22 LAB — COMPLETE METABOLIC PANEL WITH GFR
AG Ratio: 1.4 (calc) (ref 1.0–2.5)
ALT: 36 U/L (ref 9–46)
AST: 28 U/L (ref 10–35)
Albumin: 4.2 g/dL (ref 3.6–5.1)
Alkaline phosphatase (APISO): 39 U/L (ref 35–144)
BUN: 13 mg/dL (ref 7–25)
CO2: 27 mmol/L (ref 20–32)
Calcium: 9.2 mg/dL (ref 8.6–10.3)
Chloride: 106 mmol/L (ref 98–110)
Creat: 0.88 mg/dL (ref 0.70–1.30)
Globulin: 3.1 g/dL (ref 1.9–3.7)
Glucose, Bld: 103 mg/dL — ABNORMAL HIGH (ref 65–99)
Potassium: 4.3 mmol/L (ref 3.5–5.3)
Sodium: 140 mmol/L (ref 135–146)
Total Bilirubin: 0.7 mg/dL (ref 0.2–1.2)
Total Protein: 7.3 g/dL (ref 6.1–8.1)
eGFR: 103 mL/min/{1.73_m2} (ref >=60)

## 2022-09-22 LAB — TEST AUTHORIZATION

## 2022-09-22 LAB — HEMOGLOBIN A1C W/OUT EAG: Hgb A1c MFr Bld: 5.7 %{Hb} — ABNORMAL HIGH (ref <5.7)

## 2022-09-22 LAB — PSA: PSA: 0.25 ng/mL (ref <=4.00)

## 2022-09-22 MED ORDER — CARVEDILOL 12.5 MG PO TABS: 12.5000 mg | ORAL_TABLET | Freq: Two times a day (BID) | ORAL | 3 refills | Status: DC

## 2022-09-24 ENCOUNTER — Encounter: Payer: 59 | Admitting: Internal Medicine

## 2022-09-29 ENCOUNTER — Other Ambulatory Visit: Payer: Self-pay | Admitting: Internal Medicine

## 2022-10-03 NOTE — Progress Notes (Unsigned)
Subjective:    Patient ID: Adrian [name], male    DOB: 1970-04-06, 52 y.o.   MRN: [id number]  HPI 52 year old Male with  Neurosarcoidosis presents for Welcome to Medicare physical exam and evaluation of medical issues.  He has a history of neurosarcoidosis and is followed at [facility].  Has been treated with Remicade.  He also is followed by endocrinologist here, Dr. Lucianne Muss who monitors his bone density scans and treats him for low testosterone.  He developed bone loss due to steroid treatment for neurosarcoidosis.  Neurosarcoidosis presented initially as a left 6th nerve palsy in June 2018.  MRI showed multiple lesions that were contrast-enhancing scattered throughout the brain.  Lumbar puncture was performed.  ACE level was 55.  ANA was negative.  TSH was normal and sed rate was 2.  He was placed on high-dose steroids and gained a lot of weight.  He was subsequently treated with Imuran by neurologist here in [place] before beginning treatment at [facility] with Remicade.  He has not been able to return to work and now has received disability benefits.  He continues to receive IV Remicade infusion at [facility].  He is followed there regularly.  His last MRI of the brain was January 2024 and there was no evidence of neurosarcoidosis on that study.  There were few hemosiderin deposits that were thought to possibly represent previous areas of neurosarcoidosis.  Social history: He is divorced.  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  Does not smoke.  Quit smoking about 20 years ago.  Sometimes uses smokeless tobacco.  Used to consume alcohol but quit.  Family history: He has 1 son and 1 daughter.  Father died at age 73 due to sepsis and AML complications.  Mother with history of ovarian cancer.  1 brother in his 58s with history of obesity, diabetes and history of MI.  1 sister in her 55s who is overweight.    Review of Systems denies chest  pain, shortness of breath, bowel or bladder issues     Objective:   Physical Exam Vital signs reviewed.  Blood pressure 140/90.  Rechecked 130/80.  Weight 292 pounds.  BMI 40.73 patient says blood pressure at home is much better than 140/90.  I have asked him to send me some readings in the next few weeks.  Skin: Warm and dry.  No cervical adenopathy.  TMs are clear.  Pharynx is clear.  No facial weakness.  Chest is clear.  Cardiac exam regular rate and rhythm without ectopy.  Abdomen: Obese soft nondistended without hepatosplenomegaly masses or tenderness.  Prostate is normal without nodules.  No lower extremity pitting edema.  He is alert and oriented x 3.  Moves all 4 extremities and has normal muscle strength.  No facial weakness observed.       Assessment & Plan:  Neurosarcoidosis treated with IV Remicade and followed at [facility].  Doing very well and recent MRI in January showed no active lesions.  BMI 40.73-continue to work on diet exercise and weight loss. Does not want to attend weight loss clinic. Obesity runs in the family.  Elevated blood pressure reading-continue to monitor blood pressure at home and let me know if persistently elevated.  I would like for him to send me some readings in the near future.  May need to be on antihypertensive medication.  Mild osteopenia on bone density study noted September 2023 due to  prior treatment with steroids.  Continue to monitor   Mild glucose intolerance- work on diet and exercise and recheck Hgb AIC in 3- 6 months. Checking BP again in September.

## 2022-10-05 NOTE — Patient Instructions (Addendum)
It was a pleasure to see you today. We are glad you are doing well. Weight remains an issue. There is some mild glucose intolerance.B;ood pressure reading is elevated today. RTC in September for repeat BP check. May need med for glucose intolerance but would like for you to work on diet and exercise first.

## 2022-10-12 DIAGNOSIS — D8689 Sarcoidosis of other sites: Secondary | ICD-10-CM | POA: Diagnosis not present

## 2022-10-14 NOTE — Progress Notes (Signed)
Patient Care Team: Margaree Mackintosh, MD as PCP - General (Internal Medicine)  Visit Date: 10/21/22  Subjective:    Patient ID: Adrian [name] , Male   DOB: [birth year], 52 y.o.    MRN: [id number]   52 y.o. Male presents today for a blood pressure check. History of hypertension treated with carvedilol 12.5 mg twice daily with a meal, furosemide 40 mg daily. He does not have a blood pressure cuff at home. Blood pressure elevated in-office today at 130/90. He has not had his carvedilol this morning. He has been taking both medications as directed. History of neurosarcoidosis.  Past Medical History:  Diagnosis Date   Allergy    Arthritis    Back pain    Bell palsy    GERD (gastroesophageal reflux disease)    Hypertension    Neurosarcoidosis    Osteopenia    Swelling    Vision abnormalities      Family History  Problem Relation Age of Onset   Cancer Mother    Hypertension Mother    Kidney Stones Mother    High Cholesterol Mother    Thyroid disease Mother    Cancer Father    Hypertension Father    Kidney Stones Father    High Cholesterol Father    Diabetes Brother    Kidney Stones Brother    Kidney Stones Sister    Cancer Maternal Grandfather    Colon cancer Paternal Grandmother    Colon polyps Neg Hx    Esophageal cancer Neg Hx    Rectal cancer Neg Hx    Stomach cancer Neg Hx     Social Hx: Divorced.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 Quit smoking some 20 years ago. Used to consume alcohol but not drinking alcohol now.     Review of Systems  Constitutional:  Negative for fever and malaise/fatigue.  HENT:  Negative for congestion.   Eyes:  Negative for blurred vision.  Respiratory:  Negative for cough and shortness of breath.   Cardiovascular:  Negative for chest pain, palpitations and leg swelling.  Gastrointestinal:  Negative for vomiting.  Musculoskeletal:  Negative for back pain.  Skin:  Negative for  rash.  Neurological:  Negative for loss of consciousness and headaches.        Objective:   Vitals: BP (!) 130/90   Pulse 62   Ht 5\' 11"  (1.803 m)   Wt 296 lb (134.3 kg)   SpO2 96%   BMI 41.28 kg/m    Physical Exam Constitutional:      General: He is not in acute distress.    Appearance: Normal appearance. He is not ill-appearing.  HENT:     Head: Normocephalic and atraumatic.  Neck:     Vascular: No carotid bruit.  Cardiovascular:     Rate and Rhythm: Normal rate and regular rhythm.     Pulses: Normal pulses.     Heart sounds: Normal heart sounds. No murmur heard.    No friction rub. No gallop.  Pulmonary:     Effort: Pulmonary effort is normal. No respiratory distress.     Breath sounds: Normal breath sounds. No wheezing or rales.  Musculoskeletal:     Right lower leg: No edema.     Left lower leg: No edema.  Skin:    General: Skin is warm and dry.  Neurological:     Mental Status: He is alert and oriented to person, place, and  time. Mental status is at baseline.  Psychiatric:        Mood and Affect: Mood normal.        Behavior: Behavior normal.        Thought Content: Thought content normal.        Judgment: Judgment normal.       Results:   Studies obtained and personally reviewed by me:   Labs:       Component Value Date/Time   NA 140 09/20/2022 0918   NA 142 03/22/2018 1034   K 4.3 09/20/2022 0918   CL 106 09/20/2022 0918   CO2 27 09/20/2022 0918   GLUCOSE 103 (H) 09/20/2022 0918   BUN 13 09/20/2022 0918   BUN 21 03/22/2018 1034   CREATININE 0.88 09/20/2022 0918   CALCIUM 9.2 09/20/2022 0918   PROT 7.3 09/20/2022 0918   PROT 6.8 03/22/2018 1034   ALBUMIN 4.1 03/22/2018 1034   AST 28 09/20/2022 0918   ALT 36 09/20/2022 0918   ALKPHOS 47 03/22/2018 1034   BILITOT 0.7 09/20/2022 0918   BILITOT 0.6 03/22/2018 1034   GFRNONAA 89 09/06/2019 0905   GFRAA 103 09/06/2019 0905     Lab Results  Component Value Date   WBC 6.3 09/20/2022    HGB 15.5 09/20/2022   HCT 44.5 09/20/2022   MCV 91.6 09/20/2022   PLT 267 09/20/2022    Lab Results  Component Value Date   CHOL 150 09/20/2022   HDL 46 09/20/2022   LDLCALC 83 09/20/2022   TRIG 112 09/20/2022   CHOLHDL 3.3 09/20/2022    Lab Results  Component Value Date   HGBA1C 5.7 (H) 09/20/2022     Lab Results  Component Value Date   TSH 1.940 07/12/2017     Lab Results  Component Value Date   PSA 0.25 09/20/2022   PSA 0.23 09/11/2021   PSA 0.23 09/09/2020      Assessment & Plan:   Hypertension: start olmesartan 20 mg daily. Continue carvedilol 12.5 mg twice daily with a meal, furosemide 40 mg daily. Blood pressure elevated in-office today at 130/90.   Return in 1 month for BP recheck.    I,Alexander Ruley,acting as a Neurosurgeon for Margaree Mackintosh, MD.,have documented all relevant documentation on the behalf of Margaree Mackintosh, MD,as directed by  Margaree Mackintosh, MD while in the presence of Margaree Mackintosh, MD.   I, Margaree Mackintosh, MD, have reviewed all documentation for this visit. The documentation on 10/23/22 for the exam, diagnosis, procedures, and orders are all accurate and complete.

## 2022-10-21 ENCOUNTER — Encounter: Payer: Self-pay | Admitting: Internal Medicine

## 2022-10-21 ENCOUNTER — Ambulatory Visit (INDEPENDENT_AMBULATORY_CARE_PROVIDER_SITE_OTHER): Payer: Medicare HMO | Admitting: Internal Medicine

## 2022-10-21 VITALS — BP 130/90 | HR 62 | Ht 71.0 in | Wt 296.0 lb

## 2022-10-21 DIAGNOSIS — Z6841 Body Mass Index (BMI) 40.0 and over, adult: Secondary | ICD-10-CM | POA: Diagnosis not present

## 2022-10-21 DIAGNOSIS — I10 Essential (primary) hypertension: Secondary | ICD-10-CM | POA: Diagnosis not present

## 2022-10-21 MED ORDER — OLMESARTAN MEDOXOMIL 20 MG PO TABS: 20.0000 mg | ORAL_TABLET | Freq: Every day | ORAL | 0 refills | Status: DC

## 2022-10-22 ENCOUNTER — Other Ambulatory Visit: Payer: Self-pay | Admitting: Internal Medicine

## 2022-10-23 NOTE — Patient Instructions (Signed)
It is suggested you start olmesartan 20 mg daily due to persistently elevated blood pressure readings.  Continue current metoprolol 12.5 mg twice daily and furosemide 40 mg daily.  Return in 1 month for blood pressure check and office visit.  It was a pleasure to see you today.

## 2022-11-12 NOTE — Progress Notes (Signed)
Patient Care Team: Margaree Mackintosh, MD as PCP - General (Internal Medicine)  Visit Date: 11/19/22  Subjective:    Patient ID: Adrian [name] , Male   DOB: 11-02-1970, 52 y.o.    MRN: [id number]   52 y.o. Male presents today for follow up on hypertension. History of hypertension is longstanding. Started shortly after dx of neurosarcoidosis. Was on high dose steroids for sometime and gained a good deal of weight. Neurosarcoidosis which is being followed at [facility]. He started olmesartan 20 mg daily on 10/21/22. Also taking carvedilol 12.5 mg twice daily with a meal, furosemide 40 mg daily. Blood pressure normal here today at 100/70. Declines dizziness, feet swelling. Reports feeling better after starting olmesartan.Has energy.Has not been checking BP at home.  Labs reviewed. Hgb AIC 5.7% in mid August. Kidney functions normal. Electrolytes normal.   Blood pressure was 130/90 on manual recheck.  Past Medical History:  Diagnosis Date   Allergy    Arthritis    Back pain    Bell palsy    GERD (gastroesophageal reflux disease)    Hypertension    Neurosarcoidosis    Osteopenia    Swelling    Vision abnormalities      Family History  Problem Relation Age of Onset   Cancer Mother    Hypertension Mother    Kidney Stones Mother    High Cholesterol Mother    Thyroid disease Mother    Cancer Father    Hypertension Father    Kidney Stones Father    High Cholesterol Father    Diabetes Brother    Kidney Stones Brother    Kidney Stones Sister    Cancer Maternal Grandfather    Colon cancer Paternal Grandmother    Colon polyps Neg Hx    Esophageal cancer Neg Hx    Rectal cancer Neg Hx    Stomach cancer Neg Hx     Social Hx: Divorced.Does not smoke. No longer consumes alcohol. 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 Unable to work.     Review of Systems  Constitutional:  Negative for fever and  malaise/fatigue.  HENT:  Negative for congestion.   Eyes:  Negative for blurred vision.  Respiratory:  Negative for cough and shortness of breath.   Cardiovascular:  Negative for chest pain, palpitations and leg swelling.  Gastrointestinal:  Negative for vomiting.  Musculoskeletal:  Negative for back pain.  Skin:  Negative for rash.  Neurological:  Negative for dizziness, loss of consciousness and headaches.        Objective:   Vitals: BP 100/70   Pulse 79   Ht 5\' 11"  (1.803 m)   Wt 287 lb (130.2 kg)   SpO2 97%   BMI 40.03 kg/m    Physical Exam Constitutional:      General: He is not in acute distress.    Appearance: Normal appearance. He is not ill-appearing.  HENT:     Head: Normocephalic and atraumatic.  Cardiovascular:     Rate and Rhythm: Normal rate and regular rhythm.     Pulses: Normal pulses.     Heart sounds: Normal heart sounds. No murmur heard.    No friction rub. No gallop.  Pulmonary:     Effort: Pulmonary effort is normal. No respiratory distress.     Breath sounds: Normal breath sounds. No wheezing or rales.  Musculoskeletal:     Right lower leg: No edema.  Left lower leg: No edema.  Skin:    General: Skin is warm and dry.  Neurological:     Mental Status: He is alert and oriented to person, place, and time. Mental status is at baseline.  Psychiatric:        Mood and Affect: Mood normal.        Behavior: Behavior normal.        Thought Content: Thought content normal.        Judgment: Judgment normal.       Results:   Studies obtained and personally reviewed by me:   Labs:       Component Value Date/Time   NA 139 11/18/2022 1000   NA 142 03/22/2018 1034   K 4.8 11/18/2022 1000   CL 103 11/18/2022 1000   CO2 25 11/18/2022 1000   GLUCOSE 98 11/18/2022 1000   BUN 13 11/18/2022 1000   BUN 21 03/22/2018 1034   CREATININE 0.85 11/18/2022 1000   CALCIUM 8.9 11/18/2022 1000   PROT 7.3 09/20/2022 0918   PROT 6.8 03/22/2018 1034    ALBUMIN 4.1 03/22/2018 1034   AST 28 09/20/2022 0918   ALT 36 09/20/2022 0918   ALKPHOS 47 03/22/2018 1034   BILITOT 0.7 09/20/2022 0918   BILITOT 0.6 03/22/2018 1034   GFRNONAA 89 09/06/2019 0905   GFRAA 103 09/06/2019 0905     Lab Results  Component Value Date   WBC 6.3 09/20/2022   HGB 15.5 09/20/2022   HCT 44.5 09/20/2022   MCV 91.6 09/20/2022   PLT 267 09/20/2022    Lab Results  Component Value Date   CHOL 150 09/20/2022   HDL 46 09/20/2022   LDLCALC 83 09/20/2022   TRIG 112 09/20/2022   CHOLHDL 3.3 09/20/2022    Lab Results  Component Value Date   HGBA1C 5.7 (H) 09/20/2022     Lab Results  Component Value Date   TSH 1.940 07/12/2017     Lab Results  Component Value Date   PSA 0.25 09/20/2022   PSA 0.23 09/11/2021   PSA 0.23 09/09/2020      Assessment & Plan:   Hypertension: treated with olmesartan 20 mg daily, carvedilol 12.5 mg twice daily with a meal, furosemide 40 mg daily. Blood pressure slightly elevated today at 130/90. He will take blood pressure measurements at home and contact us if pressure is persistently elevated.  Had basic metabolic panel drawn on October 17 as he is now on Olmesartan and result is normal.Return in August 2025 for CPE, Monitor BP at home, watch weight and try to exercise more.Continue follow up of  Neursarcoidosis at [facility] as a scribe for Margaree Mackintosh, MD.,have documented all relevant documentation on the behalf of Margaree Mackintosh, MD,as directed by  Margaree Mackintosh, MD while in the presence of Margaree Mackintosh, MD.   I, Margaree Mackintosh, MD, have reviewed all documentation for this visit. The documentation on 11/22/22 for the exam, diagnosis, procedures, and orders are all accurate and complete.

## 2022-11-18 ENCOUNTER — Other Ambulatory Visit: Payer: Medicare HMO

## 2022-11-18 DIAGNOSIS — I10 Essential (primary) hypertension: Secondary | ICD-10-CM | POA: Diagnosis not present

## 2022-11-19 ENCOUNTER — Encounter: Payer: Self-pay | Admitting: Internal Medicine

## 2022-11-19 ENCOUNTER — Ambulatory Visit (INDEPENDENT_AMBULATORY_CARE_PROVIDER_SITE_OTHER): Payer: Medicare HMO | Admitting: Internal Medicine

## 2022-11-19 VITALS — BP 100/70 | HR 79 | Ht 71.0 in | Wt 287.0 lb

## 2022-11-19 DIAGNOSIS — Z6841 Body Mass Index (BMI) 40.0 and over, adult: Secondary | ICD-10-CM | POA: Diagnosis not present

## 2022-11-19 DIAGNOSIS — D8689 Sarcoidosis of other sites: Secondary | ICD-10-CM | POA: Diagnosis not present

## 2022-11-19 DIAGNOSIS — I10 Essential (primary) hypertension: Secondary | ICD-10-CM

## 2022-11-19 LAB — BASIC METABOLIC PANEL
BUN: 13 mg/dL (ref 7–25)
CO2: 25 mmol/L (ref 20–32)
Calcium: 8.9 mg/dL (ref 8.6–10.3)
Chloride: 103 mmol/L (ref 98–110)
Creat: 0.85 mg/dL (ref 0.70–1.30)
Glucose, Bld: 98 mg/dL (ref 65–99)
Potassium: 4.8 mmol/L (ref 3.5–5.3)
Sodium: 139 mmol/L (ref 135–146)

## 2022-11-19 NOTE — Patient Instructions (Signed)
Continue

## 2022-11-22 DIAGNOSIS — Z6841 Body Mass Index (BMI) 40.0 and over, adult: Secondary | ICD-10-CM | POA: Insufficient documentation

## 2022-11-25 DIAGNOSIS — D8689 Sarcoidosis of other sites: Secondary | ICD-10-CM | POA: Diagnosis not present

## 2023-01-06 DIAGNOSIS — D8689 Sarcoidosis of other sites: Secondary | ICD-10-CM | POA: Diagnosis not present

## 2023-01-10 ENCOUNTER — Other Ambulatory Visit: Payer: Self-pay | Admitting: Internal Medicine

## 2023-01-21 ENCOUNTER — Other Ambulatory Visit: Payer: Self-pay | Admitting: Internal Medicine

## 2023-02-07 ENCOUNTER — Other Ambulatory Visit: Payer: Self-pay | Admitting: Internal Medicine

## 2023-02-17 DIAGNOSIS — D860 Sarcoidosis of lung: Secondary | ICD-10-CM | POA: Diagnosis not present

## 2023-02-17 DIAGNOSIS — D8689 Sarcoidosis of other sites: Secondary | ICD-10-CM | POA: Diagnosis not present

## 2023-02-17 DIAGNOSIS — D869 Sarcoidosis, unspecified: Secondary | ICD-10-CM | POA: Diagnosis not present

## 2023-02-17 DIAGNOSIS — R0600 Dyspnea, unspecified: Secondary | ICD-10-CM | POA: Diagnosis not present

## 2023-02-17 DIAGNOSIS — S2243XA Multiple fractures of ribs, bilateral, initial encounter for closed fracture: Secondary | ICD-10-CM | POA: Diagnosis not present

## 2023-02-17 DIAGNOSIS — M954 Acquired deformity of chest and rib: Secondary | ICD-10-CM | POA: Diagnosis not present

## 2023-03-04 DIAGNOSIS — D8689 Sarcoidosis of other sites: Secondary | ICD-10-CM | POA: Diagnosis not present

## 2023-03-31 DIAGNOSIS — D8689 Sarcoidosis of other sites: Secondary | ICD-10-CM | POA: Diagnosis not present

## 2023-04-19 ENCOUNTER — Other Ambulatory Visit: Payer: Self-pay | Admitting: Internal Medicine

## 2023-05-02 ENCOUNTER — Other Ambulatory Visit: Payer: Self-pay | Admitting: Internal Medicine

## 2023-05-12 DIAGNOSIS — D8689 Sarcoidosis of other sites: Secondary | ICD-10-CM | POA: Diagnosis not present

## 2023-05-13 ENCOUNTER — Other Ambulatory Visit: Payer: Self-pay | Admitting: Internal Medicine

## 2023-05-13 ENCOUNTER — Other Ambulatory Visit: Payer: Self-pay

## 2023-05-13 DIAGNOSIS — K219 Gastro-esophageal reflux disease without esophagitis: Secondary | ICD-10-CM

## 2023-05-13 MED ORDER — PANTOPRAZOLE SODIUM 40 MG PO TBEC
40.0000 mg | DELAYED_RELEASE_TABLET | Freq: Every day | ORAL | 0 refills | Status: DC
Start: 2023-05-13 — End: 2023-09-26

## 2023-05-31 ENCOUNTER — Other Ambulatory Visit: Payer: Self-pay | Admitting: Internal Medicine

## 2023-06-23 DIAGNOSIS — D8689 Sarcoidosis of other sites: Secondary | ICD-10-CM | POA: Diagnosis not present

## 2023-08-09 DIAGNOSIS — D8689 Sarcoidosis of other sites: Secondary | ICD-10-CM | POA: Diagnosis not present

## 2023-09-06 ENCOUNTER — Other Ambulatory Visit: Payer: Self-pay | Admitting: Internal Medicine

## 2023-09-20 DIAGNOSIS — D8689 Sarcoidosis of other sites: Secondary | ICD-10-CM | POA: Diagnosis not present

## 2023-09-22 ENCOUNTER — Other Ambulatory Visit: Payer: Medicare HMO

## 2023-09-22 DIAGNOSIS — K219 Gastro-esophageal reflux disease without esophagitis: Secondary | ICD-10-CM

## 2023-09-22 DIAGNOSIS — I10 Essential (primary) hypertension: Secondary | ICD-10-CM

## 2023-09-22 DIAGNOSIS — Z6841 Body Mass Index (BMI) 40.0 and over, adult: Secondary | ICD-10-CM

## 2023-09-22 DIAGNOSIS — Z0000 Encounter for general adult medical examination without abnormal findings: Secondary | ICD-10-CM

## 2023-09-22 DIAGNOSIS — Z13220 Encounter for screening for lipoid disorders: Secondary | ICD-10-CM

## 2023-09-22 DIAGNOSIS — D8689 Sarcoidosis of other sites: Secondary | ICD-10-CM

## 2023-09-22 NOTE — Addendum Note (Signed)
 Addended by: Devarius Nelles P on: 09/22/2023 10:20 AM   Modules accepted: Orders

## 2023-09-23 ENCOUNTER — Other Ambulatory Visit

## 2023-09-23 ENCOUNTER — Ambulatory Visit: Payer: Medicare HMO | Admitting: Internal Medicine

## 2023-09-23 DIAGNOSIS — R7302 Impaired glucose tolerance (oral): Secondary | ICD-10-CM | POA: Diagnosis not present

## 2023-09-23 DIAGNOSIS — Z6841 Body Mass Index (BMI) 40.0 and over, adult: Secondary | ICD-10-CM | POA: Diagnosis not present

## 2023-09-23 DIAGNOSIS — K219 Gastro-esophageal reflux disease without esophagitis: Secondary | ICD-10-CM | POA: Diagnosis not present

## 2023-09-23 DIAGNOSIS — I10 Essential (primary) hypertension: Secondary | ICD-10-CM

## 2023-09-23 DIAGNOSIS — Z125 Encounter for screening for malignant neoplasm of prostate: Secondary | ICD-10-CM | POA: Diagnosis not present

## 2023-09-23 DIAGNOSIS — Z0000 Encounter for general adult medical examination without abnormal findings: Secondary | ICD-10-CM | POA: Diagnosis not present

## 2023-09-24 LAB — COMPLETE METABOLIC PANEL WITHOUT GFR
AG Ratio: 1.4 (calc) (ref 1.0–2.5)
ALT: 60 U/L — ABNORMAL HIGH (ref 9–46)
AST: 34 U/L (ref 10–35)
Albumin: 4.2 g/dL (ref 3.6–5.1)
Alkaline phosphatase (APISO): 35 U/L (ref 35–144)
BUN: 15 mg/dL (ref 7–25)
CO2: 30 mmol/L (ref 20–32)
Calcium: 9.3 mg/dL (ref 8.6–10.3)
Chloride: 103 mmol/L (ref 98–110)
Creat: 0.84 mg/dL (ref 0.70–1.30)
Globulin: 2.9 g/dL (ref 1.9–3.7)
Glucose, Bld: 100 mg/dL — ABNORMAL HIGH (ref 65–99)
Potassium: 4.4 mmol/L (ref 3.5–5.3)
Sodium: 141 mmol/L (ref 135–146)
Total Bilirubin: 0.8 mg/dL (ref 0.2–1.2)
Total Protein: 7.1 g/dL (ref 6.1–8.1)

## 2023-09-24 LAB — PSA: PSA: 0.27 ng/mL (ref <=4.00)

## 2023-09-24 LAB — CBC WITH DIFFERENTIAL/PLATELET
Absolute Lymphocytes: 4788 {cells}/uL — ABNORMAL HIGH (ref 850–3900)
Absolute Monocytes: 599 {cells}/uL (ref 200–950)
Basophils Absolute: 81 {cells}/uL (ref 0–200)
Basophils Relative: 1.1 %
Eosinophils Absolute: 148 {cells}/uL (ref 15–500)
Eosinophils Relative: 2 %
HCT: 41.3 % (ref 38.5–50.0)
Hemoglobin: 14.1 g/dL (ref 13.2–17.1)
MCH: 32 pg (ref 27.0–33.0)
MCHC: 34.1 g/dL (ref 32.0–36.0)
MCV: 93.9 fL (ref 80.0–100.0)
MPV: 10 fL (ref 7.5–12.5)
Monocytes Relative: 8.1 %
Neutro Abs: 1783 {cells}/uL (ref 1500–7800)
Neutrophils Relative %: 24.1 %
Platelets: 266 Thousand/uL (ref 140–400)
RBC: 4.4 Million/uL (ref 4.20–5.80)
RDW: 11.8 % (ref 11.0–15.0)
Total Lymphocyte: 64.7 %
WBC: 7.4 Thousand/uL (ref 3.8–10.8)

## 2023-09-24 LAB — LIPID PANEL
Cholesterol: 147 mg/dL (ref <200)
HDL: 36 mg/dL — ABNORMAL LOW (ref >=40)
LDL Cholesterol (Calc): 86 mg/dL
Non-HDL Cholesterol (Calc): 111 mg/dL (ref <130)
Total CHOL/HDL Ratio: 4.1 (calc) (ref <5.0)
Triglycerides: 151 mg/dL — ABNORMAL HIGH (ref <150)

## 2023-09-24 LAB — HEMOGLOBIN A1C
Hgb A1c MFr Bld: 5.8 % — ABNORMAL HIGH (ref <5.7)
Mean Plasma Glucose: 120 mg/dL
eAG (mmol/L): 6.6 mmol/L

## 2023-09-26 ENCOUNTER — Encounter: Payer: Self-pay | Admitting: Internal Medicine

## 2023-09-26 ENCOUNTER — Ambulatory Visit (INDEPENDENT_AMBULATORY_CARE_PROVIDER_SITE_OTHER): Admitting: Internal Medicine

## 2023-09-26 VITALS — BP 120/80 | HR 74 | Ht 71.0 in | Wt 306.0 lb

## 2023-09-26 DIAGNOSIS — R7302 Impaired glucose tolerance (oral): Secondary | ICD-10-CM

## 2023-09-26 DIAGNOSIS — Z0000 Encounter for general adult medical examination without abnormal findings: Secondary | ICD-10-CM | POA: Diagnosis not present

## 2023-09-26 DIAGNOSIS — Z6841 Body Mass Index (BMI) 40.0 and over, adult: Secondary | ICD-10-CM | POA: Diagnosis not present

## 2023-09-26 DIAGNOSIS — K219 Gastro-esophageal reflux disease without esophagitis: Secondary | ICD-10-CM

## 2023-09-26 DIAGNOSIS — M8580 Other specified disorders of bone density and structure, unspecified site: Secondary | ICD-10-CM

## 2023-09-26 DIAGNOSIS — T50905A Adverse effect of unspecified drugs, medicaments and biological substances, initial encounter: Secondary | ICD-10-CM

## 2023-09-26 DIAGNOSIS — I10 Essential (primary) hypertension: Secondary | ICD-10-CM | POA: Diagnosis not present

## 2023-09-26 DIAGNOSIS — R609 Edema, unspecified: Secondary | ICD-10-CM

## 2023-09-26 DIAGNOSIS — D8689 Sarcoidosis of other sites: Secondary | ICD-10-CM

## 2023-09-26 LAB — POCT URINALYSIS DIP (CLINITEK)
Bilirubin, UA: NEGATIVE
Blood, UA: NEGATIVE
Glucose, UA: NEGATIVE mg/dL
Ketones, POC UA: NEGATIVE mg/dL
Leukocytes, UA: NEGATIVE
Nitrite, UA: NEGATIVE
POC PROTEIN,UA: NEGATIVE
Spec Grav, UA: 1.015 (ref 1.010–1.025)
Urobilinogen, UA: 0.2 U/dL
pH, UA: 6.5 (ref 5.0–8.0)

## 2023-09-26 MED ORDER — NYSTATIN-TRIAMCINOLONE 100000-0.1 UNIT/GM-% EX OINT: TOPICAL_OINTMENT | CUTANEOUS | 99 refills | Status: AC
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
## 2023-09-26 NOTE — Patient Instructions (Signed)
Next appointment: Follow up in one year for your annual wellness visit.  Preventive Care 2 Years and Older, Male Preventive care refers to lifestyle choices and visits with your health care provider that can promote health and wellness. What does preventive care include? A yearly physical exam. This is also called an annual well check. Dental exams once or twice a year. Routine eye exams. Ask your health care provider how often you should have your eyes checked. Personal lifestyle choices, including: Daily care of your teeth and gums. Regular physical activity. Eating a healthy diet. Avoiding tobacco and drug use. Limiting alcohol use. Practicing safe sex. Taking low doses of aspirin every day. Taking vitamin and mineral supplements as recommended by your health care provider. What happens during an annual well check? The services and screenings done by your health care provider during your annual well check will depend on your age, overall health, lifestyle risk factors, and family history of disease. Counseling  Your health care provider may ask you questions about your: Alcohol use. Tobacco use. Drug use. Emotional well-being. Home and relationship well-being. Sexual activity. Eating habits. History of falls. Memory and ability to understand (cognition). Work and work Astronomer. Screening  You may have the following tests or measurements: Height, weight, and BMI. Blood pressure. Lipid and cholesterol levels. These may be checked every 5 years, or more frequently if you are over 48 years old. Skin check. Lung cancer screening. You may have this screening every year starting at age 16 if you have a 30-pack-year history of smoking and currently smoke or have quit within the past 15 years. Fecal occult blood test (FOBT) of the stool. You may have this test every year starting at age 13. Flexible sigmoidoscopy or colonoscopy. You may have a sigmoidoscopy every 5 years or a  colonoscopy every 10 years starting at age 54. Prostate cancer screening. Recommendations will vary depending on your family history and other risks. Hepatitis C blood test. Hepatitis B blood test. Sexually transmitted disease (STD) testing. Diabetes screening. This is done by checking your blood sugar (glucose) after you have not eaten for a while (fasting). You may have this done every 1-3 years. Abdominal aortic aneurysm (AAA) screening. You may need this if you are a current or former smoker. Osteoporosis. You may be screened starting at age 64 if you are at high risk. Talk with your health care provider about your test results, treatment options, and if necessary, the need for more tests. Vaccines  Your health care provider may recommend certain vaccines, such as: Influenza vaccine. This is recommended every year. Tetanus, diphtheria, and acellular pertussis (Tdap, Td) vaccine. You may need a Td booster every 10 years. Zoster vaccine. You may need this after age 10. Pneumococcal 13-valent conjugate (PCV13) vaccine. One dose is recommended after age 63. Pneumococcal polysaccharide (PPSV23) vaccine. One dose is recommended after age 65. Talk to your health care provider about which screenings and vaccines you need and how often you need them. This information is not intended to replace advice given to you by your health care provider. Make sure you discuss any questions you have with your health care provider. Document Released: 02/14/2015 Document Revised: 10/08/2015 Document Reviewed: 11/19/2014 Elsevier Interactive Patient Education  2017 [employer].  Fall Prevention in the Home Falls can cause injuries. They can happen to people of all ages. There are many things you can do to make your home safe and to help prevent falls. What can I do on  the outside of my home? Regularly fix the edges of walkways and driveways and fix any cracks. Remove anything that might make you trip as you  walk through a door, such as a raised step or threshold. Trim any bushes or trees on the path to your home. Use bright outdoor lighting. Clear any walking paths of anything that might make someone trip, such as rocks or tools. Regularly check to see if handrails are loose or broken. Make sure that both sides of any steps have handrails. Any raised decks and porches should have guardrails on the edges. Have any leaves, snow, or ice cleared regularly. Use sand or salt on walking paths during winter. Clean up any spills in your garage right away. This includes oil or grease spills. What can I do in the bathroom? Use night lights. Install grab bars by the toilet and in the tub and shower. Do not use towel bars as grab bars. Use non-skid mats or decals in the tub or shower. If you need to sit down in the shower, use a plastic, non-slip stool. Keep the floor dry. Clean up any water that spills on the floor as soon as it happens. Remove soap buildup in the tub or shower regularly. Attach bath mats securely with double-sided non-slip rug tape. Do not have throw rugs and other things on the floor that can make you trip. What can I do in the bedroom? Use night lights. Make sure that you have a light by your bed that is easy to reach. Do not use any sheets or blankets that are too big for your bed. They should not hang down onto the floor. Have a firm chair that has side arms. You can use this for support while you get dressed. Do not have throw rugs and other things on the floor that can make you trip. What can I do in the kitchen? Clean up any spills right away. Avoid walking on wet floors. Keep items that you use a lot in easy-to-reach places. If you need to reach something above you, use a strong step stool that has a grab bar. Keep electrical cords out of the way. Do not use floor polish or wax that makes floors slippery. If you must use wax, use non-skid floor wax. Do not have throw rugs  and other things on the floor that can make you trip. What can I do with my stairs? Do not leave any items on the stairs. Make sure that there are handrails on both sides of the stairs and use them. Fix handrails that are broken or loose. Make sure that handrails are as long as the stairways. Check any carpeting to make sure that it is firmly attached to the stairs. Fix any carpet that is loose or worn. Avoid having throw rugs at the top or bottom of the stairs. If you do have throw rugs, attach them to the floor with carpet tape. Make sure that you have a light switch at the top of the stairs and the bottom of the stairs. If you do not have them, ask someone to add them for you. What else can I do to help prevent falls? Wear shoes that: Do not have high heels. Have rubber bottoms. Are comfortable and fit you well. Are closed at the toe. Do not wear sandals. If you use a stepladder: Make sure that it is fully opened. Do not climb a closed stepladder. Make sure that both sides of the stepladder are locked  into place. Ask someone to hold it for you, if possible. Clearly mark and make sure that you can see: Any grab bars or handrails. First and last steps. Where the edge of each step is. Use tools that help you move around (mobility aids) if they are needed. These include: Canes. Walkers. Scooters. Crutches. Turn on the lights when you go into a dark area. Replace any light bulbs as soon as they burn out. Set up your furniture so you have a clear path. Avoid moving your furniture around. If any of your floors are uneven, fix them. If there are any pets around you, be aware of where they are. Review your medicines with your doctor. Some medicines can make you feel dizzy. This can increase your chance of falling. Ask your doctor what other things that you can do to help prevent falls. This information is not intended to replace advice given to you by your health care provider. Make sure  you discuss any questions you have with your health care provider. Document Released: 11/14/2008 Document Revised: 06/26/2015 Document Reviewed: 02/22/2014 Elsevier Interactive Patient Education  2017 [employer].

## 2023-10-17 DIAGNOSIS — D8689 Sarcoidosis of other sites: Secondary | ICD-10-CM | POA: Diagnosis not present

## 2023-11-01 DIAGNOSIS — D8689 Sarcoidosis of other sites: Secondary | ICD-10-CM | POA: Diagnosis not present

## 2023-11-03 ENCOUNTER — Other Ambulatory Visit: Payer: Self-pay | Admitting: Internal Medicine

## 2023-12-13 DIAGNOSIS — D8689 Sarcoidosis of other sites: Secondary | ICD-10-CM | POA: Diagnosis not present

## 2024-01-05 ENCOUNTER — Other Ambulatory Visit: Payer: Self-pay | Admitting: Internal Medicine

## 2024-02-24 ENCOUNTER — Other Ambulatory Visit: Payer: Self-pay | Admitting: Internal Medicine

## 2024-09-27 ENCOUNTER — Other Ambulatory Visit: Payer: Self-pay

## 2024-09-28 ENCOUNTER — Ambulatory Visit: Payer: Self-pay | Admitting: Internal Medicine
